# Patient Record
Sex: Female | Born: 1990 | Race: White | Hispanic: No | Marital: Single | Smoking: Never smoker
Health system: Southern US, Community
[De-identification: ages and names within clinical notes are randomized; demographics above are authoritative.]

## PROBLEM LIST (undated history)

## (undated) DIAGNOSIS — E559 Vitamin D deficiency, unspecified: Secondary | ICD-10-CM

## (undated) DIAGNOSIS — N390 Urinary tract infection, site not specified: Secondary | ICD-10-CM

## (undated) DIAGNOSIS — A749 Chlamydial infection, unspecified: Secondary | ICD-10-CM

## (undated) DIAGNOSIS — Z87442 Personal history of urinary calculi: Secondary | ICD-10-CM

## (undated) DIAGNOSIS — R51 Headache: Secondary | ICD-10-CM

## (undated) DIAGNOSIS — K219 Gastro-esophageal reflux disease without esophagitis: Secondary | ICD-10-CM

## (undated) DIAGNOSIS — N200 Calculus of kidney: Secondary | ICD-10-CM

## (undated) DIAGNOSIS — Z8619 Personal history of other infectious and parasitic diseases: Secondary | ICD-10-CM

## (undated) DIAGNOSIS — Z8719 Personal history of other diseases of the digestive system: Secondary | ICD-10-CM

## (undated) HISTORY — DX: Vitamin D deficiency, unspecified: E55.9

## (undated) HISTORY — DX: Chlamydial infection, unspecified: A74.9

## (undated) HISTORY — DX: Urinary tract infection, site not specified: N39.0

## (undated) HISTORY — DX: Personal history of other infectious and parasitic diseases: Z86.19

## (undated) HISTORY — DX: Personal history of other diseases of the digestive system: Z87.19

## (undated) HISTORY — PX: CHOLECYSTECTOMY: SHX55

## (undated) HISTORY — DX: Calculus of kidney: N20.0

## (undated) HISTORY — DX: Personal history of urinary calculi: Z87.442

---

## 2009-09-18 ENCOUNTER — Inpatient Hospital Stay (HOSPITAL_COMMUNITY): Admission: EM | Admit: 2009-09-18 | Discharge: 2009-09-26 | Payer: Self-pay | Admitting: Surgery

## 2009-09-18 ENCOUNTER — Ambulatory Visit: Payer: Self-pay | Admitting: Advanced Practice Midwife

## 2009-09-18 ENCOUNTER — Encounter: Payer: Self-pay | Admitting: Obstetrics and Gynecology

## 2009-09-20 ENCOUNTER — Encounter (INDEPENDENT_AMBULATORY_CARE_PROVIDER_SITE_OTHER): Payer: Self-pay | Admitting: Surgery

## 2009-09-20 ENCOUNTER — Ambulatory Visit: Payer: Self-pay | Admitting: Internal Medicine

## 2009-12-17 ENCOUNTER — Inpatient Hospital Stay (HOSPITAL_COMMUNITY): Admission: AD | Admit: 2009-12-17 | Discharge: 2009-12-17 | Payer: Self-pay | Admitting: Obstetrics and Gynecology

## 2010-01-05 ENCOUNTER — Encounter (INDEPENDENT_AMBULATORY_CARE_PROVIDER_SITE_OTHER): Payer: Self-pay | Admitting: Obstetrics and Gynecology

## 2010-01-05 ENCOUNTER — Inpatient Hospital Stay (HOSPITAL_COMMUNITY): Admission: RE | Admit: 2010-01-05 | Discharge: 2010-01-07 | Payer: Self-pay | Admitting: Obstetrics and Gynecology

## 2010-10-17 LAB — CBC
HCT: 34 % — ABNORMAL LOW (ref 36.0–46.0)
Hemoglobin: 11.9 g/dL — ABNORMAL LOW (ref 12.0–15.0)
MCHC: 35 g/dL (ref 30.0–36.0)
MCV: 88 fL (ref 78.0–100.0)
Platelets: 179 10*3/uL (ref 150–400)
Platelets: 189 10*3/uL (ref 150–400)
RBC: 3.9 MIL/uL (ref 3.87–5.11)
RDW: 12.7 % (ref 11.5–15.5)
RDW: 12.8 % (ref 11.5–15.5)
WBC: 9.4 10*3/uL (ref 4.0–10.5)

## 2010-10-20 LAB — COMPREHENSIVE METABOLIC PANEL
ALT: 114 U/L — ABNORMAL HIGH (ref 0–35)
AST: 116 U/L — ABNORMAL HIGH (ref 0–37)
BUN: 5 mg/dL — ABNORMAL LOW (ref 6–23)
Chloride: 105 mEq/L (ref 96–112)
Creatinine, Ser: 0.5 mg/dL (ref 0.4–1.2)
GFR calc Af Amer: >60 mL/min (ref >60)
GFR calc non Af Amer: >60 mL/min (ref >60)
Potassium: 3.7 mEq/L (ref 3.5–5.1)
Sodium: 137 mEq/L (ref 135–145)

## 2010-10-20 LAB — CBC
MCHC: 34.1 g/dL (ref 30.0–36.0)
MCV: 92.6 fL (ref 78.0–100.0)
Platelets: 200 10*3/uL (ref 150–400)
RBC: 4.19 MIL/uL (ref 3.87–5.11)
WBC: 9.1 10*3/uL (ref 4.0–10.5)

## 2010-10-20 LAB — AMYLASE: Amylase: 50 U/L (ref 0–105)

## 2010-10-21 LAB — COMPREHENSIVE METABOLIC PANEL
ALT: 61 U/L — ABNORMAL HIGH (ref 0–35)
AST: 32 U/L (ref 0–37)
AST: 43 U/L — ABNORMAL HIGH (ref 0–37)
AST: 64 U/L — ABNORMAL HIGH (ref 0–37)
Albumin: 2.1 g/dL — ABNORMAL LOW (ref 3.5–5.2)
Albumin: 2.2 g/dL — ABNORMAL LOW (ref 3.5–5.2)
Alkaline Phosphatase: 86 U/L (ref 39–117)
CO2: 26 mEq/L (ref 19–32)
CO2: 27 mEq/L (ref 19–32)
Calcium: 7.7 mg/dL — ABNORMAL LOW (ref 8.4–10.5)
Calcium: 8.1 mg/dL — ABNORMAL LOW (ref 8.4–10.5)
Calcium: 8.2 mg/dL — ABNORMAL LOW (ref 8.4–10.5)
Calcium: 8.5 mg/dL (ref 8.4–10.5)
Chloride: 108 mEq/L (ref 96–112)
Creatinine, Ser: 0.5 mg/dL (ref 0.4–1.2)
Creatinine, Ser: 0.56 mg/dL (ref 0.4–1.2)
Creatinine, Ser: 0.59 mg/dL (ref 0.4–1.2)
GFR calc Af Amer: >60 mL/min (ref >60)
GFR calc Af Amer: >60 mL/min (ref >60)
GFR calc Af Amer: >60 mL/min (ref >60)
GFR calc non Af Amer: >60 mL/min (ref >60)
GFR calc non Af Amer: >60 mL/min (ref >60)
Glucose, Bld: 105 mg/dL — ABNORMAL HIGH (ref 70–99)
Glucose, Bld: 67 mg/dL — ABNORMAL LOW (ref 70–99)
Glucose, Bld: 89 mg/dL (ref 70–99)
Potassium: 3.4 mEq/L — ABNORMAL LOW (ref 3.5–5.1)
Potassium: 3.8 mEq/L (ref 3.5–5.1)
Sodium: 138 mEq/L (ref 135–145)
Total Bilirubin: 1.7 mg/dL — ABNORMAL HIGH (ref 0.3–1.2)
Total Bilirubin: 2.5 mg/dL — ABNORMAL HIGH (ref 0.3–1.2)
Total Protein: 5.3 g/dL — ABNORMAL LOW (ref 6.0–8.3)
Total Protein: 5.3 g/dL — ABNORMAL LOW (ref 6.0–8.3)

## 2010-10-21 LAB — DIFFERENTIAL
Basophils Absolute: 0 10*3/uL (ref 0.0–0.1)
Basophils Absolute: 0 10*3/uL (ref 0.0–0.1)
Eosinophils Relative: 0 % (ref 0–5)
Eosinophils Relative: 1 % (ref 0–5)
Lymphocytes Relative: 10 % — ABNORMAL LOW (ref 12–46)
Lymphocytes Relative: 13 % (ref 12–46)
Lymphs Abs: 1.2 10*3/uL (ref 0.7–4.0)
Lymphs Abs: 1.3 10*3/uL (ref 0.7–4.0)
Monocytes Absolute: 1 10*3/uL (ref 0.1–1.0)
Monocytes Relative: 5 % (ref 3–12)
Monocytes Relative: 8 % (ref 3–12)
Neutro Abs: 8 10*3/uL — ABNORMAL HIGH (ref 1.7–7.7)
Neutrophils Relative %: 81 % — ABNORMAL HIGH (ref 43–77)

## 2010-10-21 LAB — CBC
HCT: 29.6 % — ABNORMAL LOW (ref 36.0–46.0)
HCT: 33.8 % — ABNORMAL LOW (ref 36.0–46.0)
HCT: 35 % — ABNORMAL LOW (ref 36.0–46.0)
Hemoglobin: 10.5 g/dL — ABNORMAL LOW (ref 12.0–15.0)
Hemoglobin: 12.4 g/dL (ref 12.0–15.0)
MCHC: 35.1 g/dL (ref 30.0–36.0)
MCHC: 35.5 g/dL (ref 30.0–36.0)
MCV: 91.9 fL (ref 78.0–100.0)
MCV: 92.7 fL (ref 78.0–100.0)
Platelets: 147 10*3/uL — ABNORMAL LOW (ref 150–400)
Platelets: 182 10*3/uL (ref 150–400)
RBC: 3.25 MIL/uL — ABNORMAL LOW (ref 3.87–5.11)
RBC: 3.67 MIL/uL — ABNORMAL LOW (ref 3.87–5.11)
RBC: 3.72 MIL/uL — ABNORMAL LOW (ref 3.87–5.11)
RBC: 3.8 MIL/uL — ABNORMAL LOW (ref 3.87–5.11)
RDW: 12.6 % (ref 11.5–15.5)
RDW: 12.9 % (ref 11.5–15.5)
WBC: 10.4 10*3/uL (ref 4.0–10.5)
WBC: 14.1 10*3/uL — ABNORMAL HIGH (ref 4.0–10.5)
WBC: 9.9 10*3/uL (ref 4.0–10.5)

## 2010-10-21 LAB — BASIC METABOLIC PANEL
GFR calc Af Amer: >60 mL/min (ref >60)
GFR calc non Af Amer: >60 mL/min (ref >60)
Glucose, Bld: 108 mg/dL — ABNORMAL HIGH (ref 70–99)
Potassium: 3.7 mEq/L (ref 3.5–5.1)
Sodium: 137 mEq/L (ref 135–145)

## 2010-10-21 LAB — LIPASE, BLOOD: Lipase: 1064 U/L — ABNORMAL HIGH (ref 11–59)

## 2010-10-21 LAB — AMYLASE: Amylase: 1605 U/L — ABNORMAL HIGH (ref 0–105)

## 2011-02-15 ENCOUNTER — Emergency Department (HOSPITAL_COMMUNITY)
Admission: EM | Admit: 2011-02-15 | Discharge: 2011-02-16 | Disposition: A | Discharge status: Home / Self Care | Payer: Medicaid Other | Attending: Emergency Medicine | Admitting: Emergency Medicine

## 2011-02-15 DIAGNOSIS — R197 Diarrhea, unspecified: Secondary | ICD-10-CM | POA: Insufficient documentation

## 2011-02-15 DIAGNOSIS — O219 Vomiting of pregnancy, unspecified: Secondary | ICD-10-CM | POA: Insufficient documentation

## 2011-03-08 LAB — GC/CHLAMYDIA PROBE AMP, GENITAL
Chlamydia: NEGATIVE
Gonorrhea: NEGATIVE

## 2011-03-08 LAB — ABO/RH: RH Type: POSITIVE

## 2011-04-15 IMAGING — US US ABDOMEN COMPLETE
1 series · 13 of 25 positions shown · non-contrast
Comparison: None

CLINICAL DATA: 18-year-old pregnant female with elevated LFTs and
abdominal pain.

ABDOMINAL ULTRASOUND COMPLETE

[Series 1: us abdomen complete · 0.25mm/px · 13 of 92 slices shown]
[im 1/92]
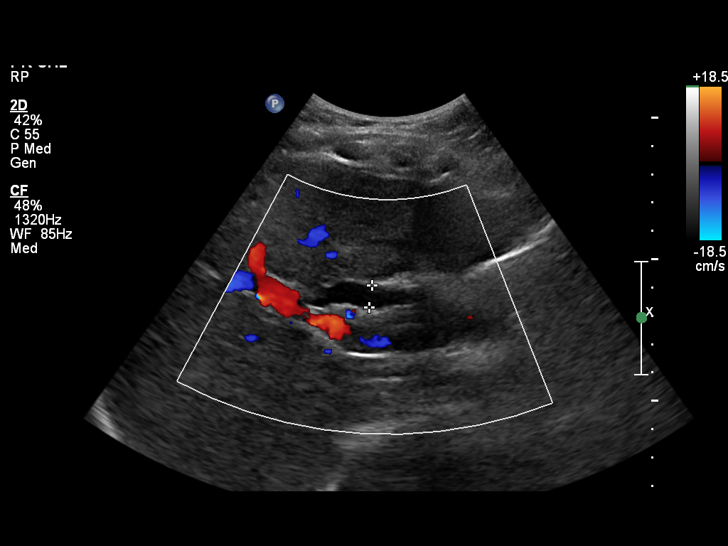
[im 8/92]
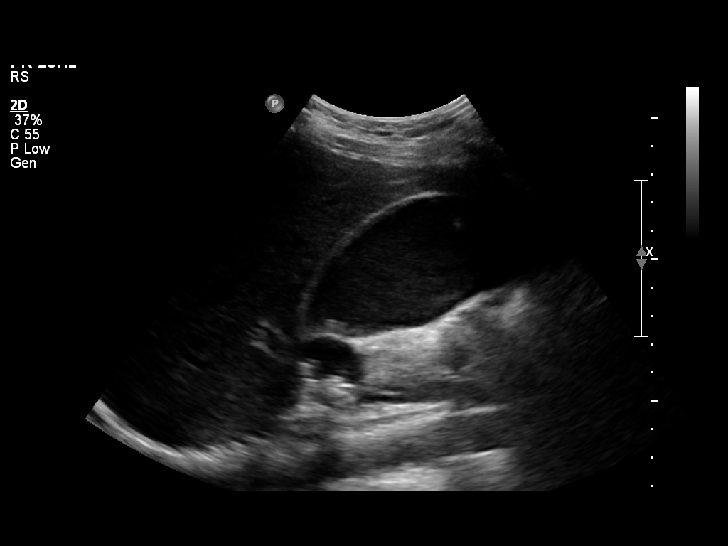
[im 16/92]
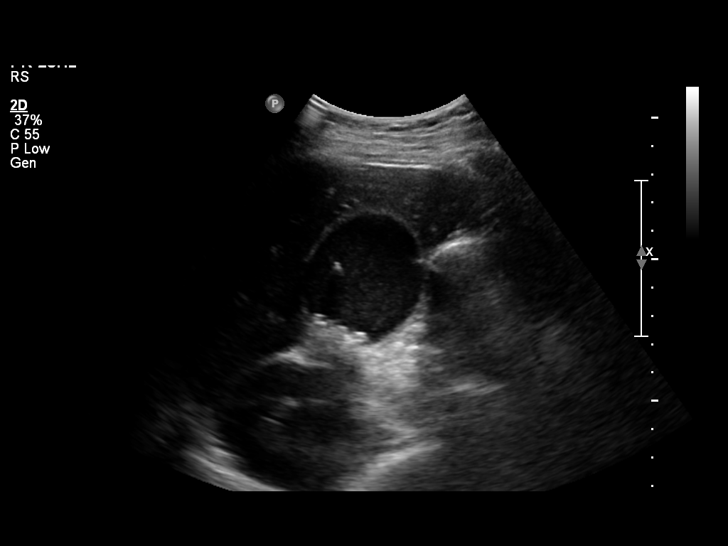
[im 23/92]
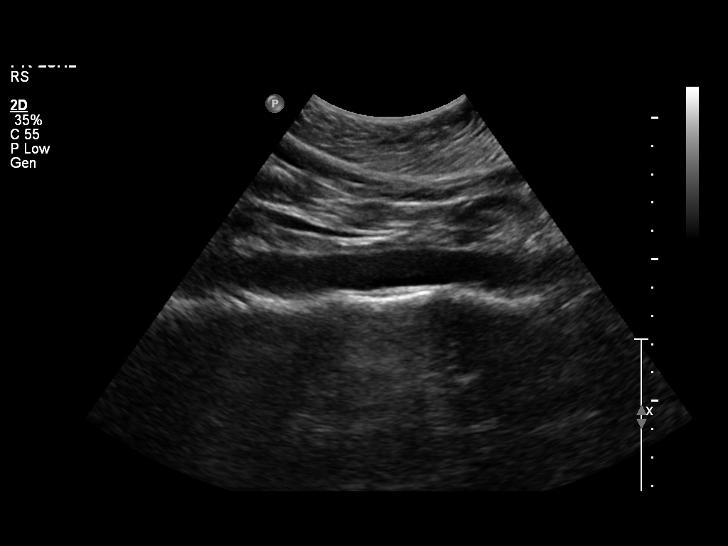
[im 31/92]
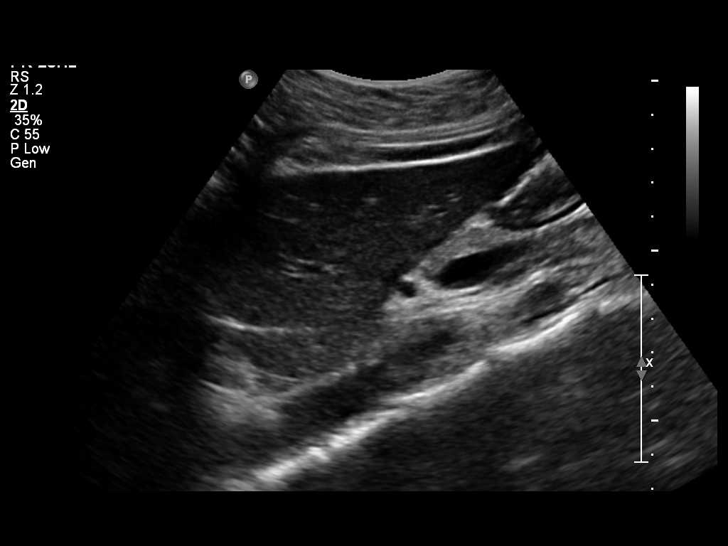
[im 38/92]
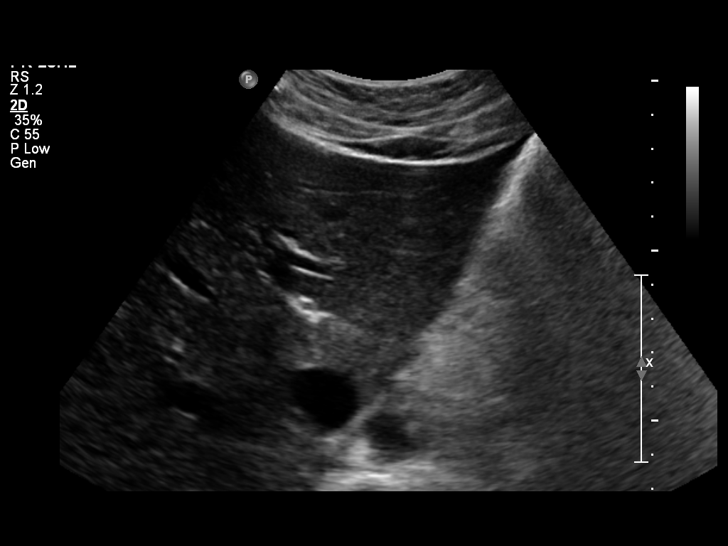
[im 46/92]
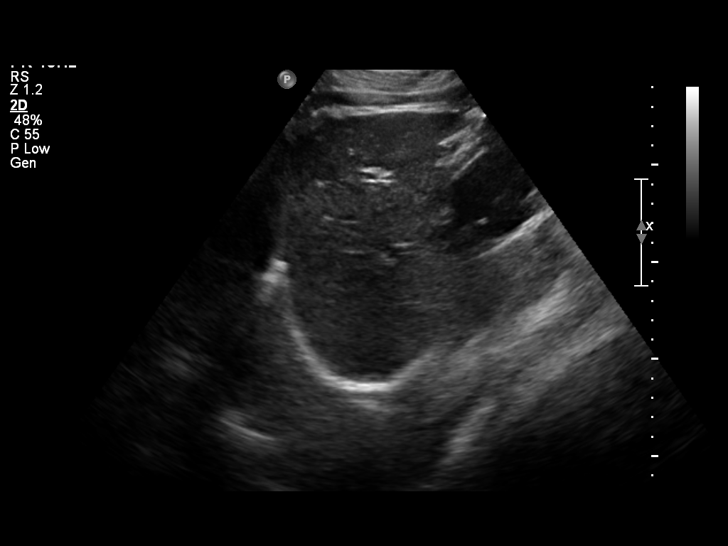
[im 54/92]
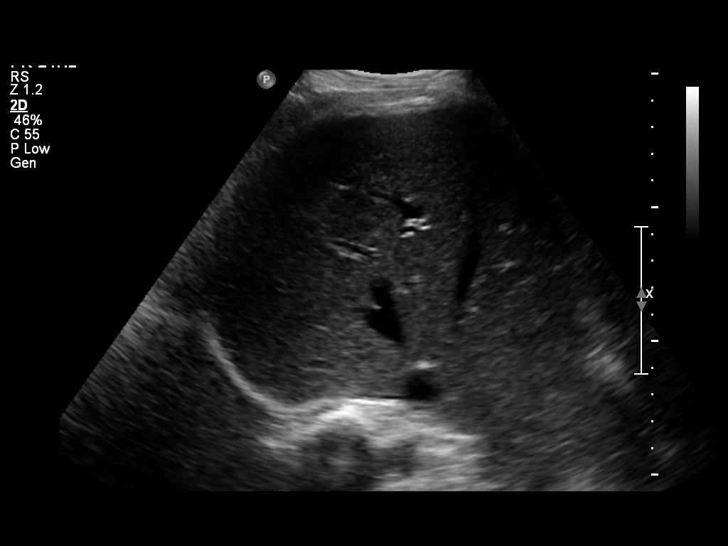
[im 61/92]
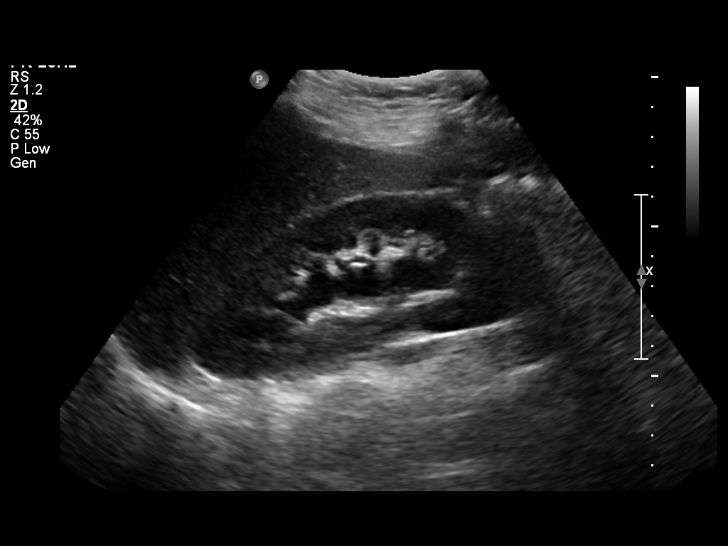
[im 69/92]
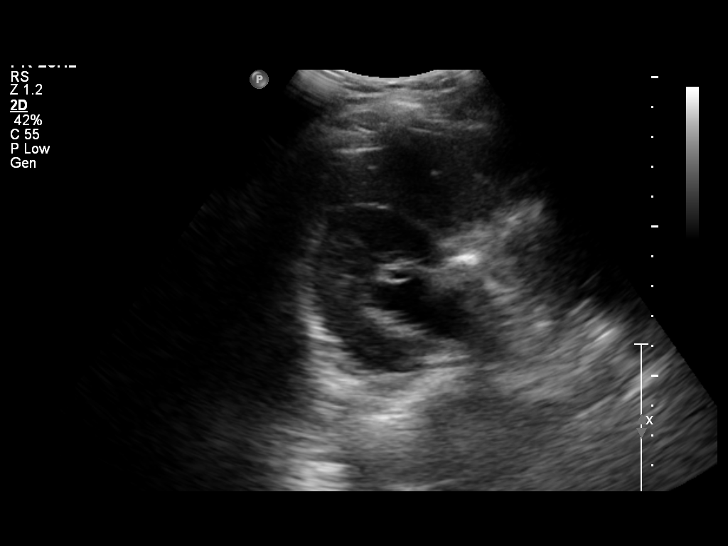
[im 76/92]
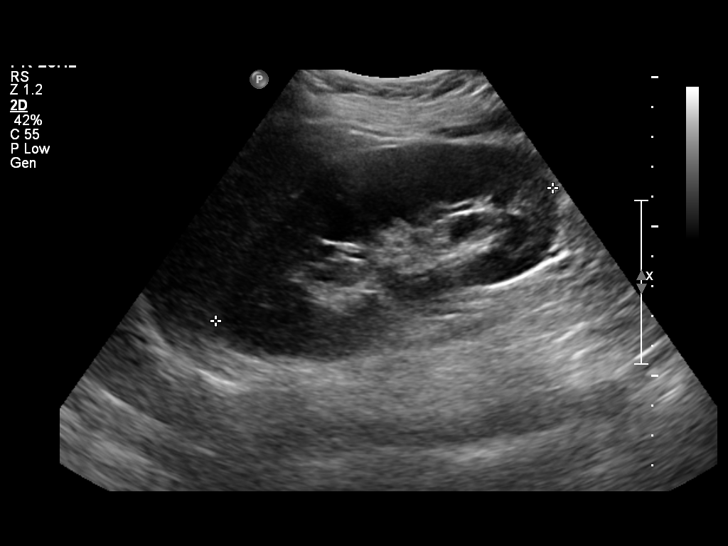
[im 84/92]
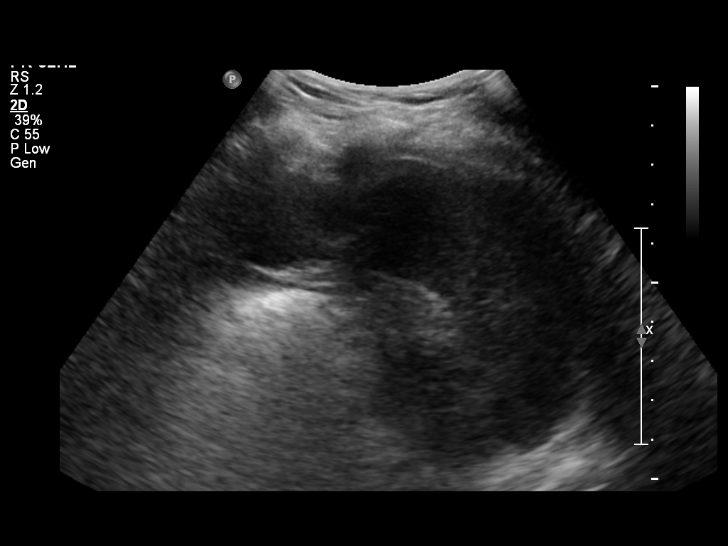
[im 92/92]
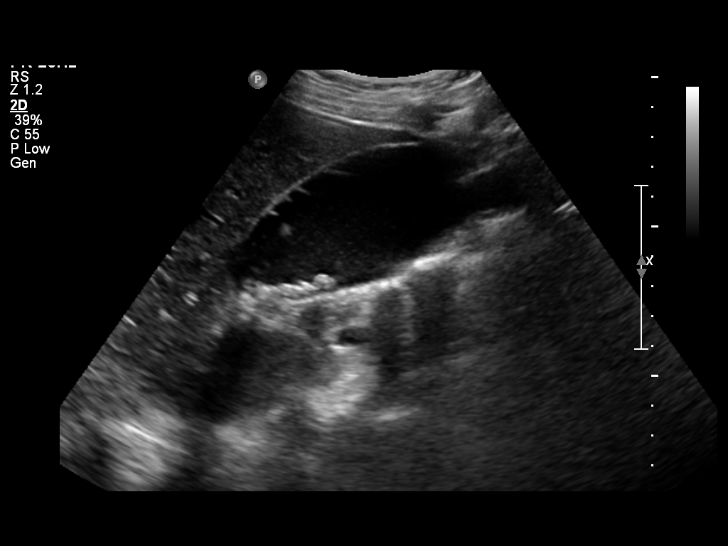

[13 of 25 positions shown; findings below may reference images not displayed]

FINDINGS: Gallbladder:  Small mobile gallstones are identified as well as
gallbladder sludge.  There is no evidence of gallbladder wall
thickening, pericholecystic fluid, or sonographic Murphy's sign.

Common Bile Duct:  Mild intrahepatic biliary fullness is identified
as well as mild CBD dilatation.  The CBD measures 8 mm in greatest
diameter.

Liver:  The liver is within normal limits in parenchymal
echogenicity. No focal abnormalities are identified.

IVC:  Appears normal.

Pancreas:  Although the pancreas is difficult to visualize in its
entirety, no focal pancreatic abnormality is identified.

Spleen:  Within normal limits in size and echotexture.

Right kidney:  The right kidney is normal in size and parenchymal
echogenicity. Mild right hydronephrosis is likely related to
pregnancy. There is no evidence of solid mass or definite renal
calculi.  The right kidney measures 12.2 cm.

Left kidney:  The left kidney is normal in size and parenchymal
echogenicity.  There is no evidence of solid mass, hydronephrosis
or definite renal calculi.   The left kidney measures 12.1 cm.

Abdominal Aorta:  No abdominal aortic aneurysm identified.

There is no evidence of ascites.
IMPRESSION: Cholelithiasis and gallbladder sludge without evidence of acute
cholecystitis.

Mild biliary dilatation without definite obstructing cause
identified.  Recommend GI consultation or further evaluation.

Mild right hydronephrosis likely related to pregnancy.

## 2011-04-16 IMAGING — RF DG CHOLANGIOGRAM OPERATIVE
1 series · 8 of 8 positions shown · non-contrast
Comparison: None

CLINICAL DATA: Cholelithiasis, pregnant

INTRAOPERATIVE CHOLANGIOGRAM
TECHNIQUE: Cholangiographic images from the C-arm fluoroscopic
device were submitted for interpretation post-operatively.  Please
see the procedural report for the amount of contrast and the
fluoroscopy time utilized.

[Series 1: run · 2 acquisitions, 8 frames shown]
[im 1/2]
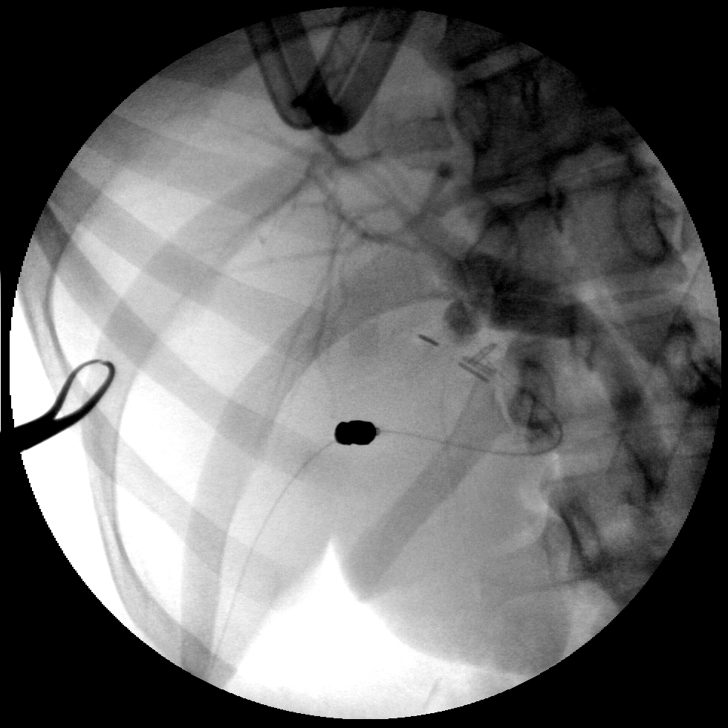
[im 1/2]
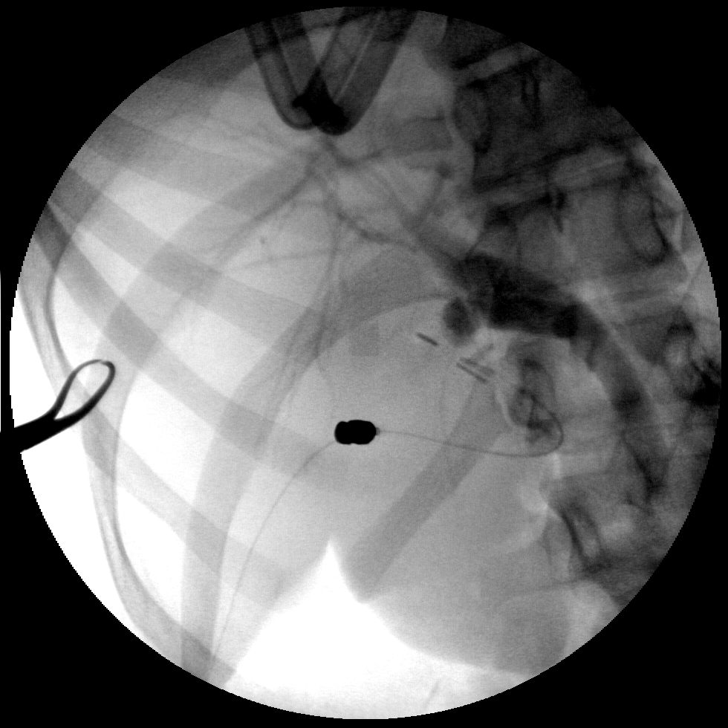
[im 1/2]
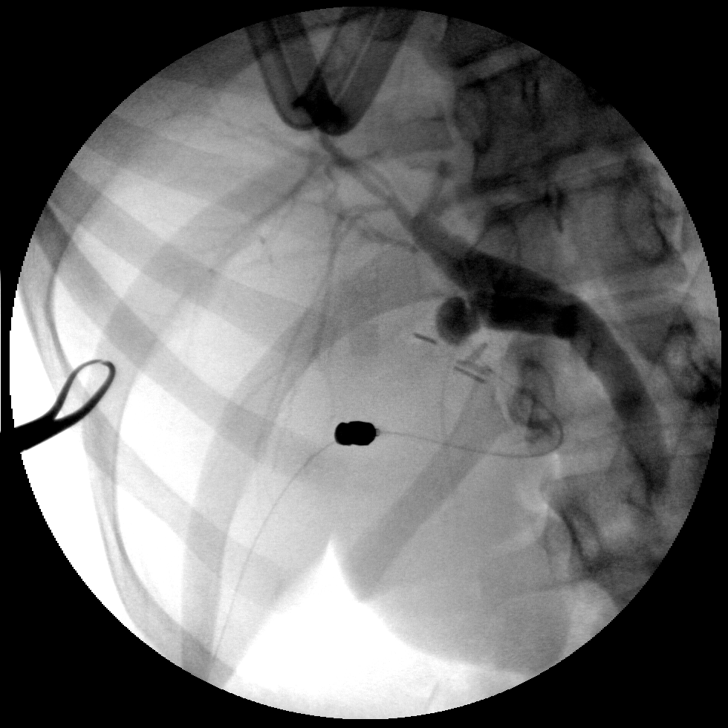
[im 1/2]
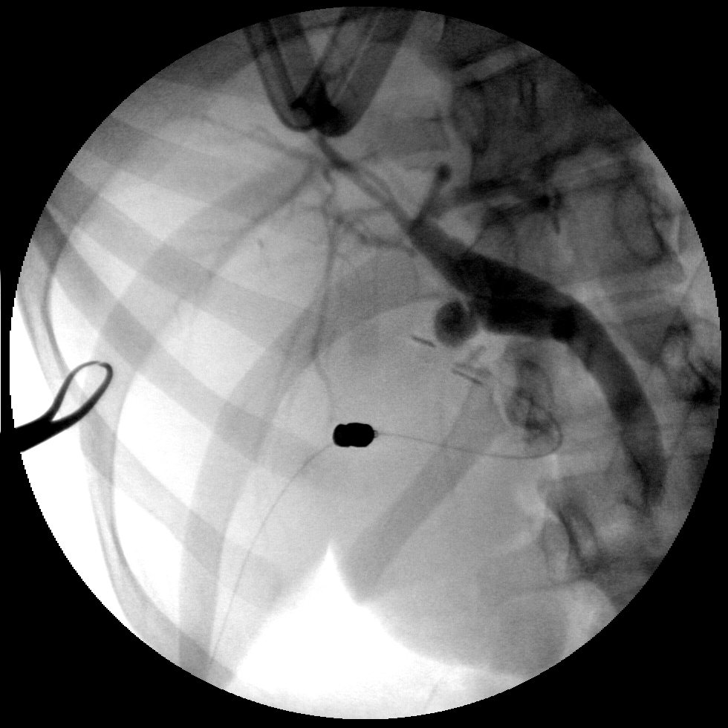
[im 2/2]
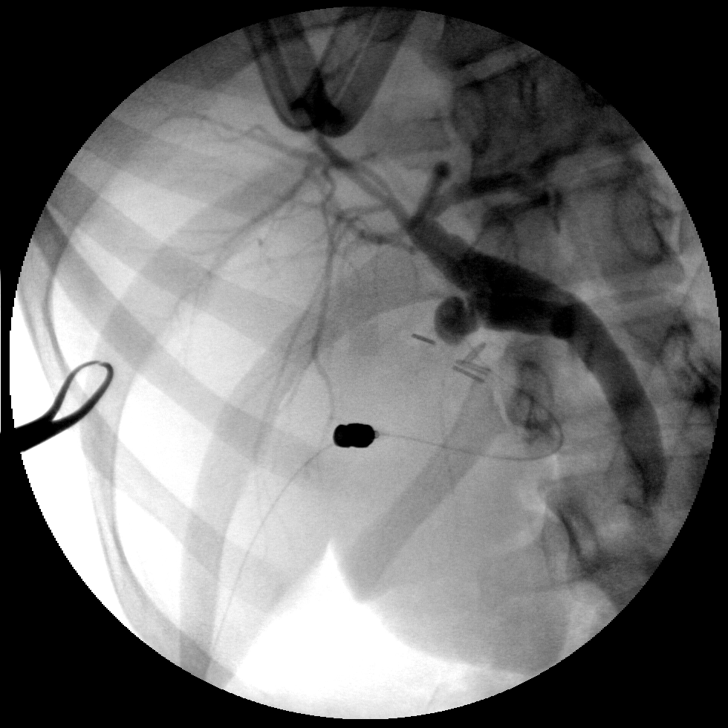
[im 2/2]
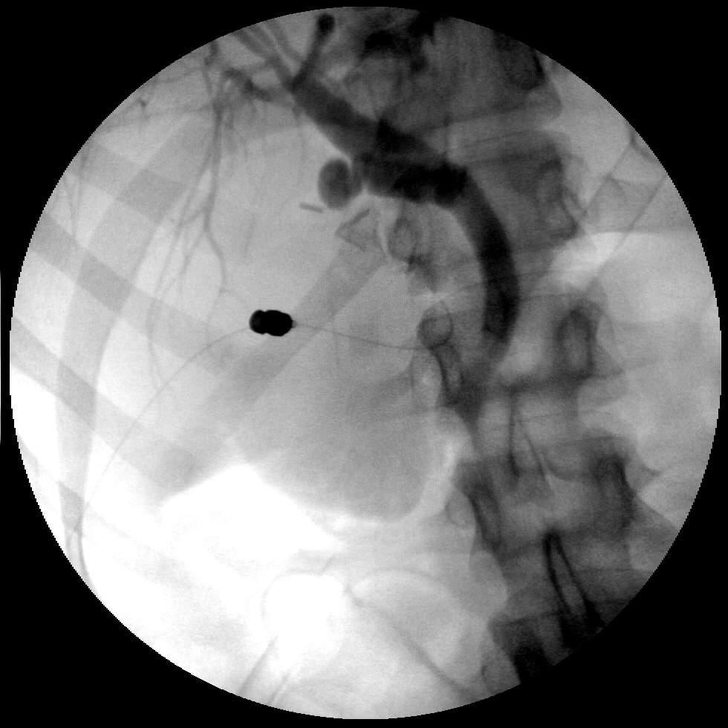
[im 2/2]
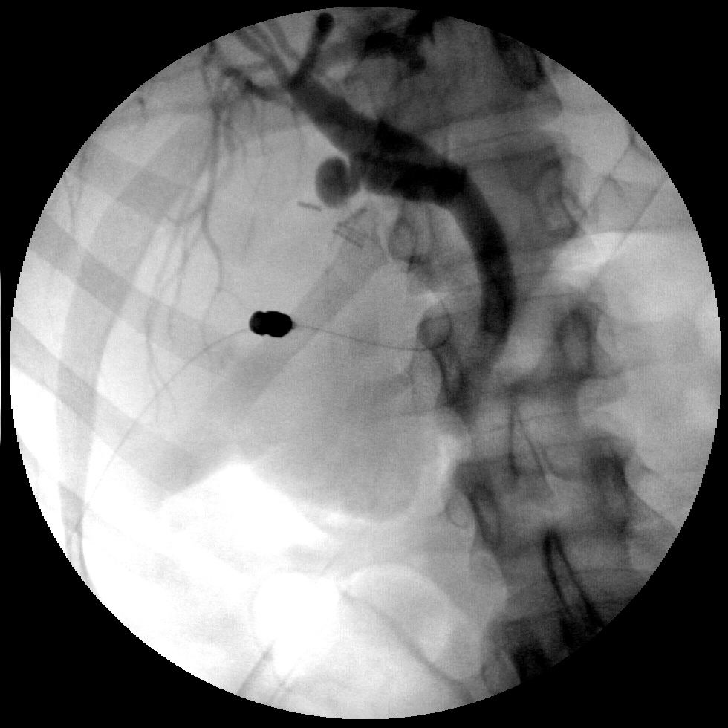
[im 2/2]
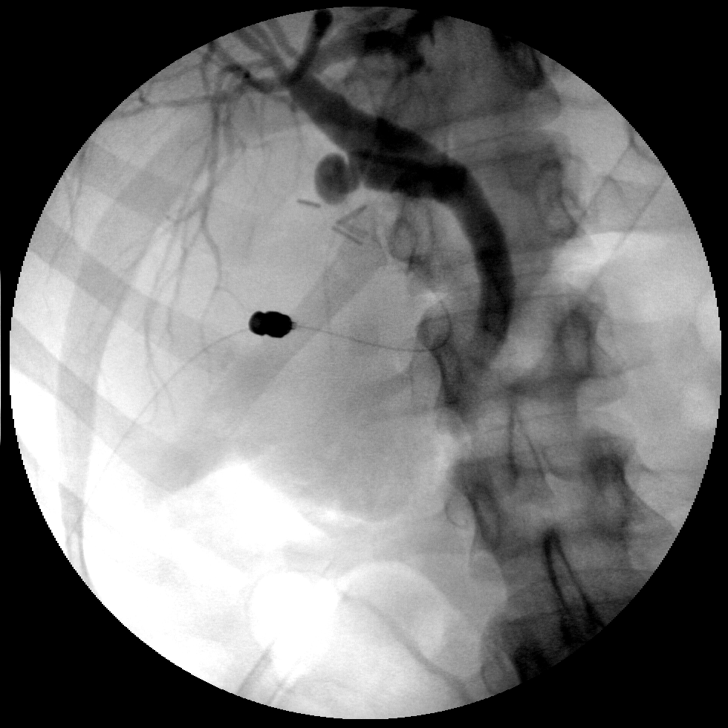

[8 of 8 positions shown; findings below may reference images not displayed]

FINDINGS: No persistent filling defects in the common duct.
Intrahepatic ducts are incompletely visualized, appearing
decompressed centrally. There is no contrast passage beyond the
ampulla into the duodenum.

IMPRESSION

Obstruction of the distal common bile duct suggesting retained
stone.

## 2011-04-16 IMAGING — CR DG CHEST 1V PORT
1 series · 1 of 1 positions shown · non-contrast
Comparison: None.

CLINICAL DATA: Postop evaluation.

PORTABLE CHEST - 1 VIEW

[view not recorded]
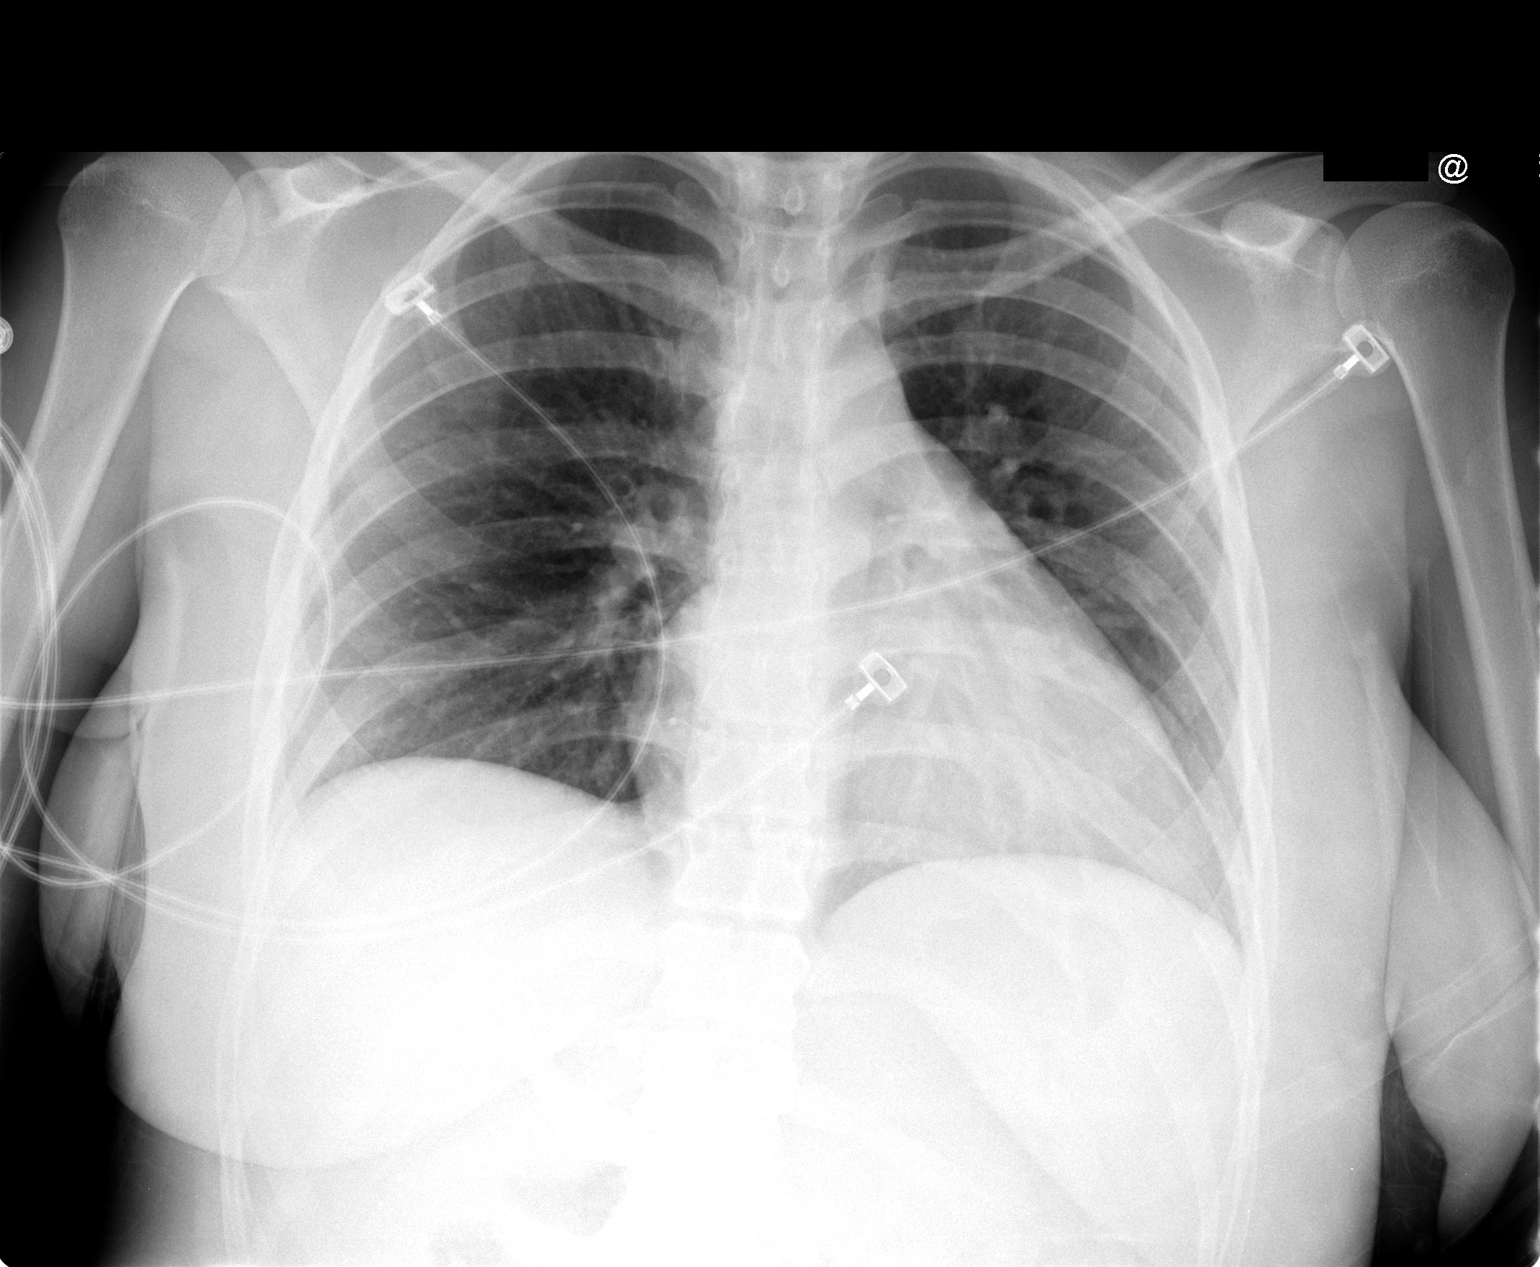

[1 of 1 positions shown; findings below may reference images not displayed]

FINDINGS: Normal mediastinum and cardiac silhouette.  Costophrenic
angles are clear.  No evidence of effusion, infiltrate,
pneumothorax.  There is mild atelectasis at the left lung base.
IMPRESSION: 1.  Mild atelectasis at the left lung base.
2.  No evidence of consolidation or pneumonitis.

## 2011-04-18 IMAGING — RF DG ERCP WO/W SPHINCTEROTOMY
1 series · 15 of 19 positions shown · non-contrast
Comparison: Intraoperative cholangiogram dated 09/19/2009.

CLINICAL DATA: Distal common duct obstruction suggesting a retained
stone on an intraoperative cholangiogram during a cholecystectomy
on 09/19/2009.

ERCP with sphincterotomy
TECHNIQUE: Multiple spot images obtained with the fluoroscopic
device and submitted for interpretation post-procedure.  ERCP was
performed by Dr. Aujla.

[Series 1: run · 15 of 19 slices shown]
[im 1/19]
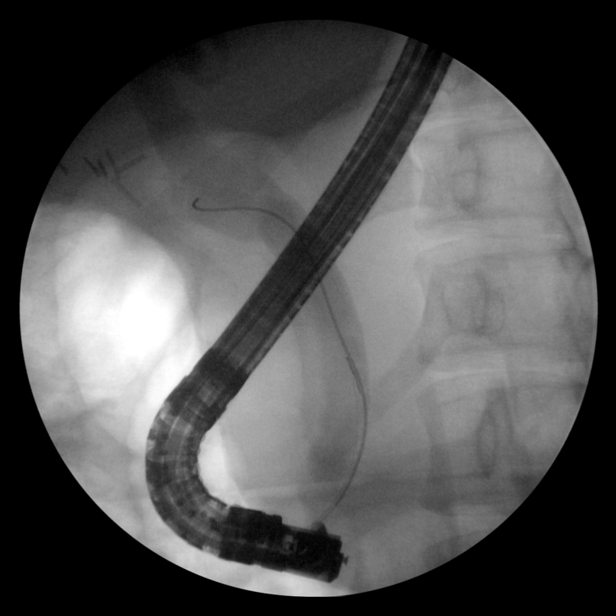
[im 2/19]
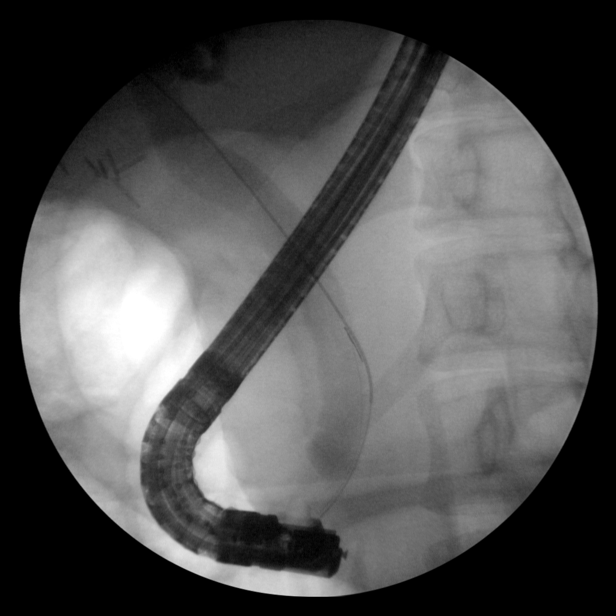
[im 4/19]
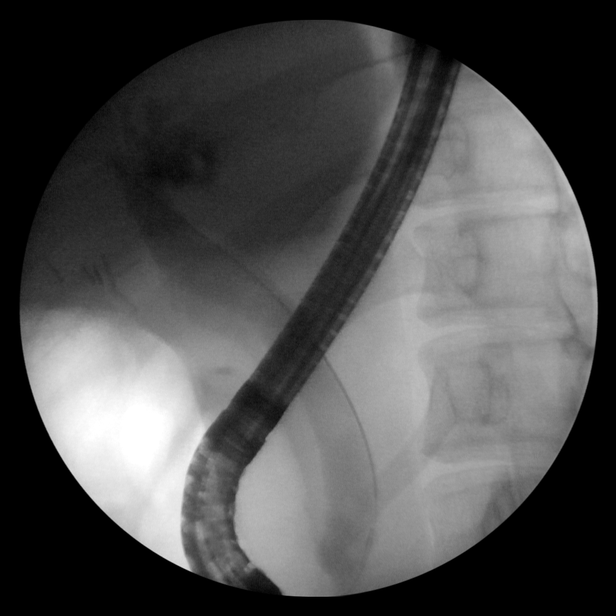
[im 5/19]
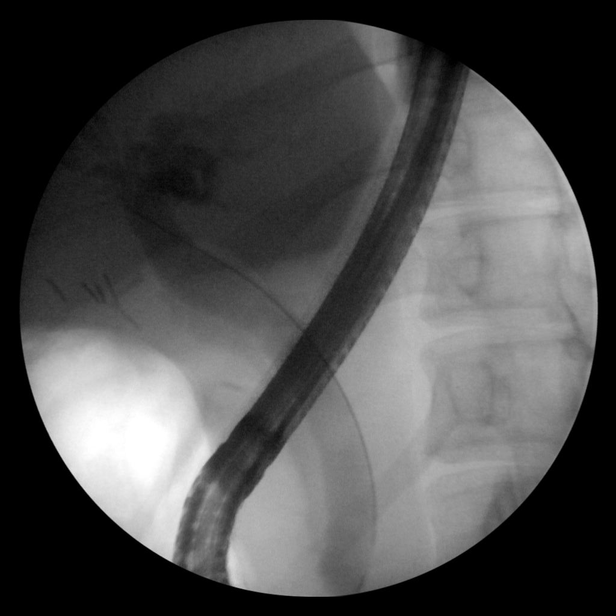
[im 6/19]
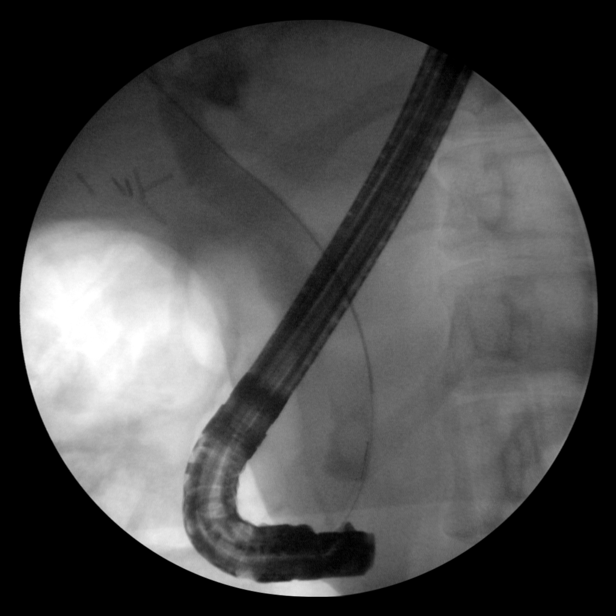
[im 7/19]
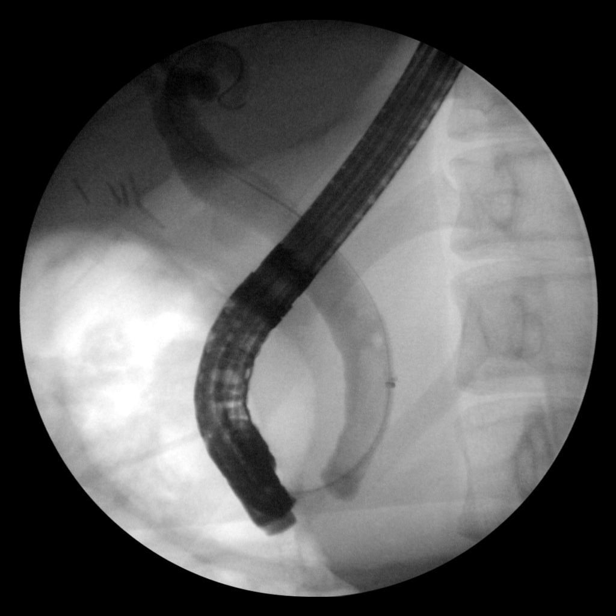
[im 9/19]
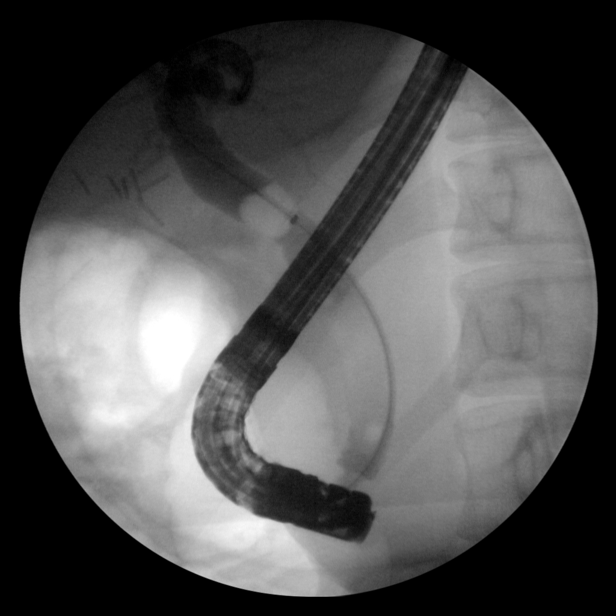
[im 10/19]
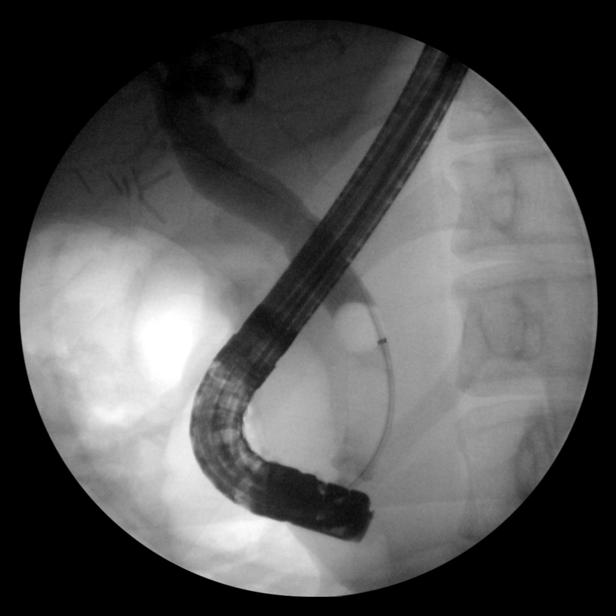
[im 11/19]
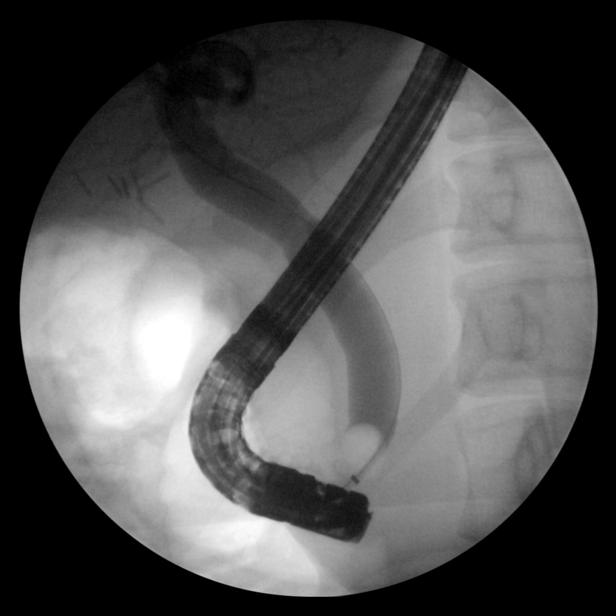
[im 13/19]
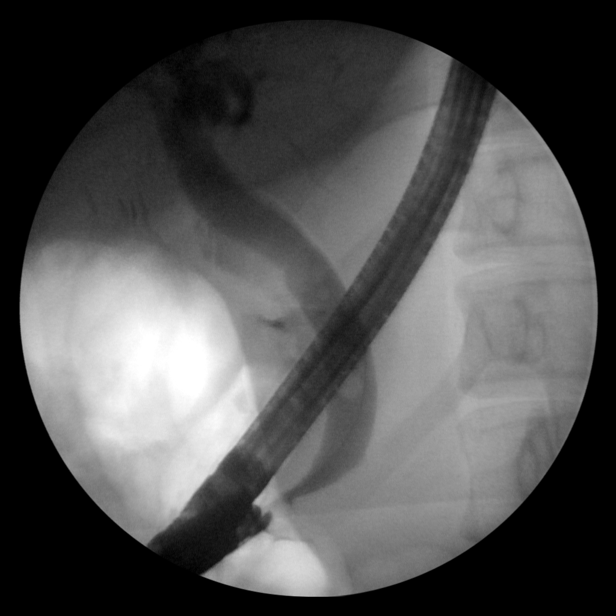
[im 14/19]
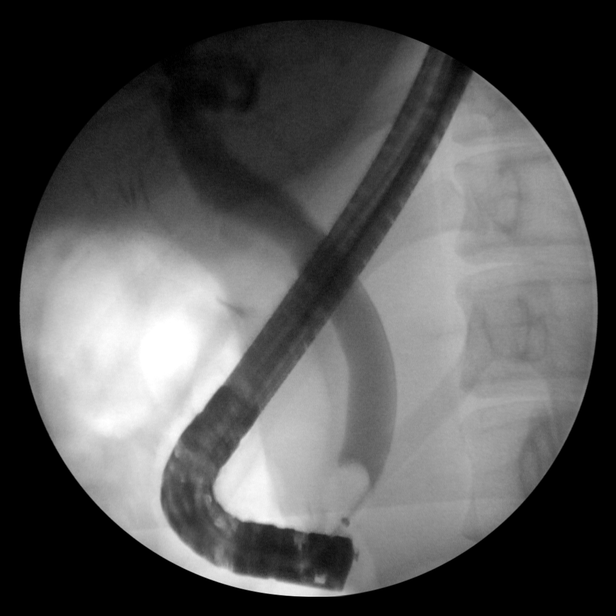
[im 15/19]
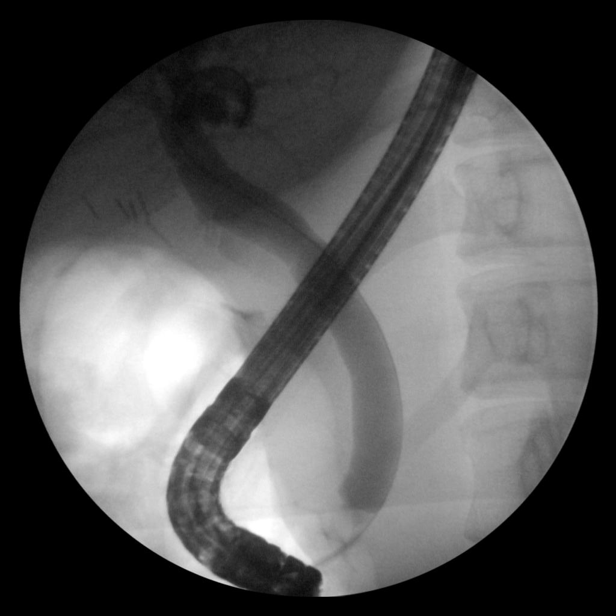
[im 16/19]
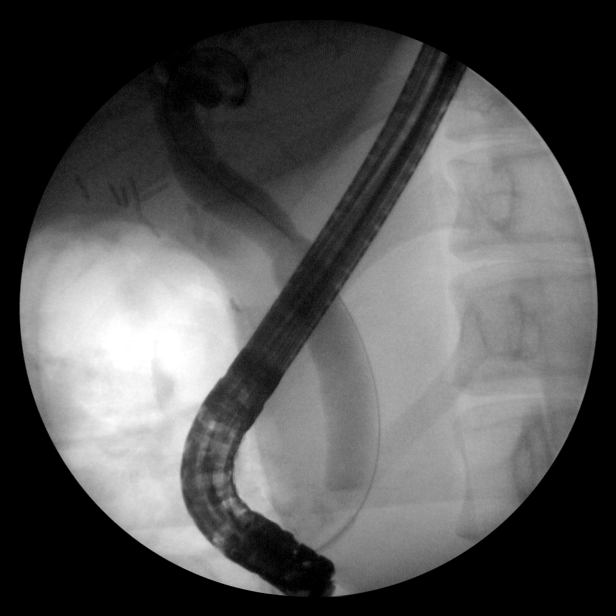
[im 18/19]
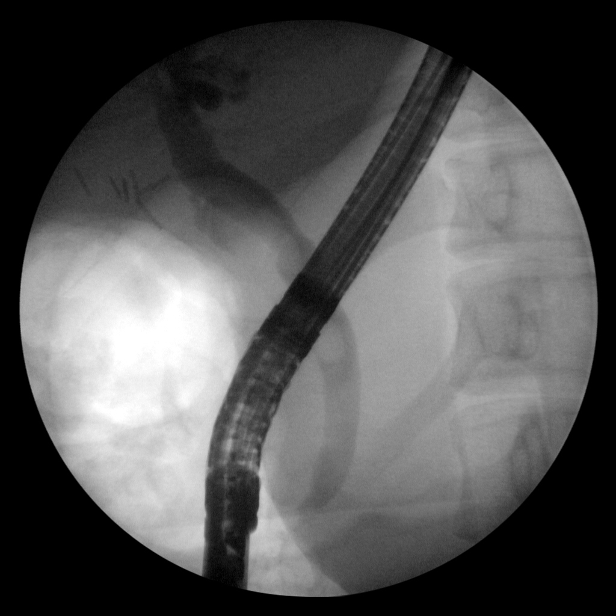
[im 19/19]
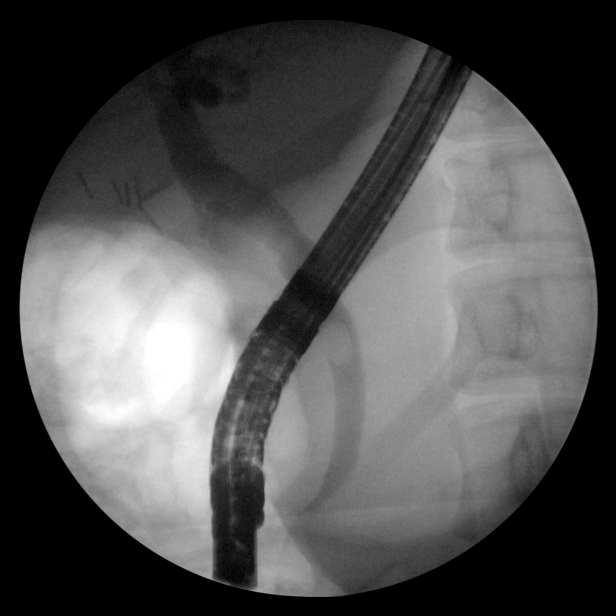

[15 of 19 positions shown; findings below may reference images not displayed]

FINDINGS: Some of the initial images are not available for
interpretation.  Dr. Aujla  reports that these demonstrated a
normal pancreatic duct and three common duct stones.  A
sphincterotomy was performed.  The current images demonstrate
passage of a balloon catheter through a dilated common duct.  There
are some filling defects following passage, which Dr. Aujla reports
represented air bubbles that subsequently cleared.  Three stones
were extracted.  Cholecystectomy clips are noted.
IMPRESSION: Endoscopic sphincterotomy and stone extraction, as described above.

These images were submitted for radiologic interpretation only.
Please see the procedural report for the amount of contrast.  21
minutes and 48 seconds of fluoroscopy time used.

## 2011-08-24 LAB — STREP B DNA PROBE: GBS: NEGATIVE

## 2011-08-31 ENCOUNTER — Inpatient Hospital Stay (HOSPITAL_COMMUNITY): Admission: AD | Admit: 2011-08-31 | Payer: Self-pay | Source: Ambulatory Visit | Admitting: Obstetrics and Gynecology

## 2011-09-06 ENCOUNTER — Telehealth (HOSPITAL_COMMUNITY): Payer: Self-pay | Admitting: *Deleted

## 2011-09-06 ENCOUNTER — Encounter (HOSPITAL_COMMUNITY): Payer: Self-pay | Admitting: *Deleted

## 2011-09-06 NOTE — Telephone Encounter (Signed)
Preadmission screen  

## 2011-09-15 ENCOUNTER — Encounter (HOSPITAL_COMMUNITY): Payer: Self-pay | Admitting: Anesthesiology

## 2011-09-15 ENCOUNTER — Encounter (HOSPITAL_COMMUNITY): Payer: Self-pay

## 2011-09-15 ENCOUNTER — Inpatient Hospital Stay (HOSPITAL_COMMUNITY): Payer: Medicaid Other | Admitting: Anesthesiology

## 2011-09-15 ENCOUNTER — Inpatient Hospital Stay (HOSPITAL_COMMUNITY)
Admission: RE | Admit: 2011-09-15 | Discharge: 2011-09-17 | DRG: 775 | Disposition: A | Discharge status: Home / Self Care | Payer: Medicaid Other | Source: Ambulatory Visit | Attending: Obstetrics and Gynecology | Admitting: Obstetrics and Gynecology

## 2011-09-15 ENCOUNTER — Other Ambulatory Visit: Payer: Self-pay | Admitting: Obstetrics and Gynecology

## 2011-09-15 DIAGNOSIS — O365990 Maternal care for other known or suspected poor fetal growth, unspecified trimester, not applicable or unspecified: Principal | ICD-10-CM | POA: Diagnosis present

## 2011-09-15 HISTORY — DX: Headache: R51

## 2011-09-15 HISTORY — DX: Gastro-esophageal reflux disease without esophagitis: K21.9

## 2011-09-15 LAB — CBC
Hemoglobin: 10.6 g/dL — ABNORMAL LOW (ref 12.0–15.0)
MCH: 27.6 pg (ref 26.0–34.0)
MCHC: 33.2 g/dL (ref 30.0–36.0)
MCV: 83.1 fL (ref 78.0–100.0)
RBC: 3.84 MIL/uL — ABNORMAL LOW (ref 3.87–5.11)

## 2011-09-15 LAB — ABO/RH: ABO/RH(D): O POS

## 2011-09-15 LAB — RPR: RPR Ser Ql: NONREACTIVE

## 2011-09-15 MED ORDER — BUTORPHANOL TARTRATE 2 MG/ML IJ SOLN
1.0000 mg | Freq: Once | INTRAMUSCULAR | Status: AC
  Administered 2011-09-15: 1 mg via INTRAVENOUS
  Filled 2011-09-15: qty 1

## 2011-09-15 MED ORDER — PHENYLEPHRINE 40 MCG/ML (10ML) SYRINGE FOR IV PUSH (FOR BLOOD PRESSURE SUPPORT): 80.0000 ug | PREFILLED_SYRINGE | INTRAVENOUS | Status: DC | PRN

## 2011-09-15 MED ORDER — FENTANYL 2.5 MCG/ML BUPIVACAINE 1/10 % EPIDURAL INFUSION (WH - ANES)
INTRAMUSCULAR | Status: DC | PRN
  Administered 2011-09-15: 14 mL/h via EPIDURAL

## 2011-09-15 MED ORDER — ONDANSETRON HCL 4 MG/2ML IJ SOLN: 4.0000 mg | INTRAMUSCULAR | Status: DC | PRN

## 2011-09-15 MED ORDER — ONDANSETRON HCL 4 MG PO TABS: 4.0000 mg | ORAL_TABLET | ORAL | Status: DC | PRN

## 2011-09-15 MED ORDER — BENZOCAINE-MENTHOL 20-0.5 % EX AERO: 1.0000 "application " | INHALATION_SPRAY | CUTANEOUS | Status: DC | PRN

## 2011-09-15 MED ORDER — OXYTOCIN 20 UNITS IN LACTATED RINGERS INFUSION - SIMPLE
125.0000 mL/h | INTRAVENOUS | Status: DC
  Administered 2011-09-15: 125 mL/h via INTRAVENOUS

## 2011-09-15 MED ORDER — ZOLPIDEM TARTRATE 5 MG PO TABS: 5.0000 mg | ORAL_TABLET | Freq: Every evening | ORAL | Status: DC | PRN

## 2011-09-15 MED ORDER — OXYTOCIN 20 UNITS IN LACTATED RINGERS INFUSION - SIMPLE
1.0000 m[IU]/min | INTRAVENOUS | Status: DC
  Administered 2011-09-15: 3 m[IU]/min via INTRAVENOUS
  Administered 2011-09-15: 999 m[IU]/min via INTRAVENOUS
  Administered 2011-09-15: 1 m[IU]/min via INTRAVENOUS

## 2011-09-15 MED ORDER — EPHEDRINE 5 MG/ML INJ: 10.0000 mg | INTRAVENOUS | Status: DC | PRN

## 2011-09-15 MED ORDER — DIPHENHYDRAMINE HCL 25 MG PO CAPS: 25.0000 mg | ORAL_CAPSULE | Freq: Four times a day (QID) | ORAL | Status: DC | PRN

## 2011-09-15 MED ORDER — PHENYLEPHRINE 40 MCG/ML (10ML) SYRINGE FOR IV PUSH (FOR BLOOD PRESSURE SUPPORT)
80.0000 ug | PREFILLED_SYRINGE | INTRAVENOUS | Status: DC | PRN
  Filled 2011-09-15: qty 5

## 2011-09-15 MED ORDER — IBUPROFEN 600 MG PO TABS
600.0000 mg | ORAL_TABLET | Freq: Four times a day (QID) | ORAL | Status: DC
  Administered 2011-09-15: 600 mg via ORAL
  Filled 2011-09-15: qty 1

## 2011-09-15 MED ORDER — PRENATAL MULTIVITAMIN CH: 1.0000 | ORAL_TABLET | Freq: Every day | ORAL | Status: DC

## 2011-09-15 MED ORDER — IBUPROFEN 600 MG PO TABS: 600.0000 mg | ORAL_TABLET | Freq: Four times a day (QID) | ORAL | Status: DC | PRN

## 2011-09-15 MED ORDER — TETANUS-DIPHTH-ACELL PERTUSSIS 5-2.5-18.5 LF-MCG/0.5 IM SUSP: 0.5000 mL | Freq: Once | INTRAMUSCULAR | Status: DC

## 2011-09-15 MED ORDER — LACTATED RINGERS IV SOLN
INTRAVENOUS | Status: DC
  Administered 2011-09-15 (×2): via INTRAVENOUS

## 2011-09-15 MED ORDER — OXYCODONE-ACETAMINOPHEN 5-325 MG PO TABS: 1.0000 | ORAL_TABLET | ORAL | Status: DC | PRN

## 2011-09-15 MED ORDER — DIBUCAINE 1 % RE OINT: 1.0000 "application " | TOPICAL_OINTMENT | RECTAL | Status: DC | PRN

## 2011-09-15 MED ORDER — LIDOCAINE HCL 1.5 % IJ SOLN
INTRAMUSCULAR | Status: DC | PRN
  Administered 2011-09-15 (×2): 5 mL via EPIDURAL

## 2011-09-15 MED ORDER — LACTATED RINGERS IV SOLN: 500.0000 mL | Freq: Once | INTRAVENOUS | Status: DC

## 2011-09-15 MED ORDER — SENNOSIDES-DOCUSATE SODIUM 8.6-50 MG PO TABS: 2.0000 | ORAL_TABLET | Freq: Every day | ORAL | Status: DC

## 2011-09-15 MED ORDER — DIPHENHYDRAMINE HCL 25 MG PO CAPS
25.0000 mg | ORAL_CAPSULE | Freq: Four times a day (QID) | ORAL | Status: DC | PRN
  Administered 2011-09-15: 25 mg via ORAL
  Filled 2011-09-15: qty 1

## 2011-09-15 MED ORDER — SIMETHICONE 80 MG PO CHEW: 80.0000 mg | CHEWABLE_TABLET | ORAL | Status: DC | PRN

## 2011-09-15 MED ORDER — OXYTOCIN BOLUS FROM INFUSION
500.0000 mL | Freq: Once | INTRAVENOUS | Status: DC
  Filled 2011-09-15: qty 500
  Filled 2011-09-15: qty 1000

## 2011-09-15 MED ORDER — OXYTOCIN 20 UNITS IN LACTATED RINGERS INFUSION - SIMPLE
125.0000 mL/h | Freq: Once | INTRAVENOUS | Status: DC
  Filled 2011-09-15: qty 1000

## 2011-09-15 MED ORDER — MEASLES, MUMPS & RUBELLA VAC ~~LOC~~ INJ
0.5000 mL | INJECTION | Freq: Once | SUBCUTANEOUS | Status: DC
  Filled 2011-09-15: qty 0.5

## 2011-09-15 MED ORDER — DIPHENHYDRAMINE HCL 50 MG/ML IJ SOLN: 12.5000 mg | INTRAMUSCULAR | Status: DC | PRN

## 2011-09-15 MED ORDER — LACTATED RINGERS IV SOLN: 500.0000 mL | INTRAVENOUS | Status: DC | PRN

## 2011-09-15 MED ORDER — TERBUTALINE SULFATE 1 MG/ML IJ SOLN: 0.2500 mg | Freq: Once | INTRAMUSCULAR | Status: DC | PRN

## 2011-09-15 MED ORDER — OXYTOCIN 20 UNITS IN LACTATED RINGERS INFUSION - SIMPLE: 1.0000 m[IU]/min | INTRAVENOUS | Status: DC

## 2011-09-15 MED ORDER — FENTANYL 2.5 MCG/ML BUPIVACAINE 1/10 % EPIDURAL INFUSION (WH - ANES)
14.0000 mL/h | INTRAMUSCULAR | Status: DC
  Administered 2011-09-15: 14 mL/h via EPIDURAL
  Filled 2011-09-15 (×2): qty 60

## 2011-09-15 MED ORDER — CLINDAMYCIN PHOSPHATE 900 MG/50ML IV SOLN
900.0000 mg | Freq: Three times a day (TID) | INTRAVENOUS | Status: DC
  Administered 2011-09-15: 900 mg via INTRAVENOUS
  Filled 2011-09-15 (×3): qty 50

## 2011-09-15 MED ORDER — EPHEDRINE 5 MG/ML INJ
10.0000 mg | INTRAVENOUS | Status: DC | PRN
  Filled 2011-09-15: qty 4

## 2011-09-15 MED ORDER — LIDOCAINE HCL (PF) 1 % IJ SOLN
30.0000 mL | INTRAMUSCULAR | Status: DC | PRN
  Administered 2011-09-15: 30 mL via SUBCUTANEOUS
  Filled 2011-09-15: qty 30

## 2011-09-15 MED ORDER — LANOLIN HYDROUS EX OINT: TOPICAL_OINTMENT | CUTANEOUS | Status: DC | PRN

## 2011-09-15 MED ORDER — WITCH HAZEL-GLYCERIN EX PADS
1.0000 "application " | MEDICATED_PAD | CUTANEOUS | Status: DC | PRN
  Administered 2011-09-16: 1 via TOPICAL

## 2011-09-15 NOTE — Progress Notes (Signed)
Transferred to 143 via wheelchair with significant other and infant

## 2011-09-15 NOTE — Anesthesia Procedure Notes (Signed)
Epidural Patient location during procedure: OB Start time: 09/15/2011 3:20 PM End time: 09/15/2011 3:24 PM Reason for block: procedure for pain  Staffing Anesthesiologist: Sandrea Hughs Performed by: anesthesiologist   Preanesthetic Checklist Completed: patient identified, site marked, surgical consent, pre-op evaluation, timeout performed, IV checked, risks and benefits discussed and monitors and equipment checked  Epidural Patient position: sitting Prep: site prepped and draped and DuraPrep Patient monitoring: continuous pulse ox and blood pressure Approach: midline Injection technique: LOR air  Needle:  Needle type: Tuohy  Needle gauge: 17 G Needle length: 9 cm Needle insertion depth: 6 cm Catheter type: closed end flexible Catheter size: 19 Gauge Catheter at skin depth: 12 cm Test dose: negative and 1.5% lidocaine  Assessment Sensory level: T8 Events: blood not aspirated, injection not painful, no injection resistance, negative IV test and no paresthesia

## 2011-09-15 NOTE — Anesthesia Postprocedure Evaluation (Signed)
Anesthesia Post Note  Patient: Michelle Foley  Procedure(s) Performed: * No procedures listed *  Anesthesia type: Epidural  Patient location: Mother/Baby  Post pain: Pain level controlled  Post assessment: Post-op Vital signs reviewed  Last Vitals:  Filed Vitals:   09/15/11 2320  BP: 114/72  Pulse: 104  Temp: 36.7 C  Resp: 20    Post vital signs: Reviewed  Level of consciousness: awake  Complications: No apparent anesthesia complications

## 2011-09-15 NOTE — Progress Notes (Signed)
Patient ID: Michelle Foley, female   DOB: 1990/10/06, 21 y.o.   MRN: [id number] Pitocin at 17 mu/ minute and contractions q 2-3 minutes. The pt has an epidural and is comfortable. Cervix 5 cm 70% effaced and the vertex is at - 2 station.

## 2011-09-15 NOTE — H&P (Signed)
NAMEVERLENE, GLANTZ              ACCOUNT NO.:  [id number]  MEDICAL RECORD NO.:  [id number]  LOCATION:  9171                          FACILITY:  [facility]  PHYSICIAN:  Malachi Pro. Ambrose Mantle, M.D. DATE OF BIRTH:  02/01/1991  DATE OF ADMISSION:  09/15/2011 DATE OF DISCHARGE:                             HISTORY & PHYSICAL   HISTORY OF PRESENT ILLNESS:  This is a 21 year old white female, para 1- 0-0-1, gravida 2, EDC September 21, 2011, admitted to the hospital for induction of labor because of a favorable cervix and suspected intrauterine growth restriction.  Blood group and type O positive, negative antibody, rubella immune, RPR nonreactive, urine culture negative.  Hepatitis B surface antigen negative, HIV negative, GC and Chlamydia negative.  First trimester screen declined.  Cystic fibrosis negative.  AFP declined.  One-hour Glucola 70.  Group B strep negative. Repeat GC and Chlamydia negative.  Repeat RPR negative.  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  This has happened with her first pregnancy.  The patient's prenatal course was complicated by small for gestational age as far as fundal height measurements were concerned.  She began to show fundal height lag at about 34 weeks.  She has had reactive nonstress tests, normal AFIs.  Her most recent nonstress test was done on September 14, 2011.  Her cervix has been 2 cm dilated, 50% effaced, vertex at -2 for the last 2 prenatal visits, and she is admitted now for induction of labor.  PAST MEDICAL HISTORY:  She is allergic to Augmentin in terms that it cause nausea and vomiting, but no true allergy.  She has no other drug intolerances.  She is not allergic to latex.  She has had a cholecystectomy during her prior pregnancy in February 2010.  Illnesses: She has had the history of cholelithiasis.  She had gonorrhea.  She has had a kidney stone, acute pancreatitis during the pregnancy  initially.  OBSTETRIC HISTORY:  In June of 2011, she delivered a 6-pound-8-ounce female infant vaginally.  The pregnancy was complicated as stated by cholelithiasis, pancreatitis, and cholecystectomy.  The patient did go to the 12th grade.  She does not use alcohol, tobacco, or illicit substance abuse.  She is not married, but is in a relationship.  PHYSICAL EXAMINATION:  VITAL SIGNS:  On admission, blood pressure is 102/60, pulse is 80. HEART:  Normal size and sounds.  No murmurs. LUNGS:  Clear to auscultation. ABDOMEN:  Soft.  Fundal height was 35 cm on September 14, 2011. CERVIX:  2 cm, 50%, vertex at -2.  ADMITTING IMPRESSION:  Intrauterine pregnancy at 39 weeks and 1 day, possible intrauterine growth restriction.  The patient is admitted for Pitocin induction of labor.    Malachi Pro. Ambrose Mantle, M.D.    TFH/MEDQ  D:  09/15/2011  T:  09/15/2011  Job:  [id number]

## 2011-09-15 NOTE — Progress Notes (Signed)
Patient ID: Michelle Foley, female   DOB: 23-Nov-1990, 21 y.o.   MRN: [id number] Delivery note: The pt became fully dilated and pushed well. She delivered a living female infant spontaneously OA over an intact perineum. Weight 7 pounds 0 ounces and Apgars 8 at 1 and 9 at 5 minutes. The placenta delivered intact and the uterus was normal. There was a tear just inferior to the clitoris that was bleeding so it was made hemostatic with 3-0 vicryl sutures and pressure.EBL 400 cc's.

## 2011-09-15 NOTE — Anesthesia Preprocedure Evaluation (Addendum)
Anesthesia Evaluation  Patient identified by MRN, date of birth, ID band Patient awake    Reviewed: Allergy & Precautions, H&P , NPO status , Patient's Chart, lab work & pertinent test results  Airway Mallampati: II TM Distance: >3 FB Neck ROM: full    Dental No notable dental hx.    Pulmonary neg pulmonary ROS,    Pulmonary exam normal       Cardiovascular neg cardio ROS     Neuro/Psych Negative Psych ROS   GI/Hepatic negative GI ROS, Neg liver ROS, GERD-  ,  Endo/Other  Negative Endocrine ROSMorbid obesity  Renal/GU negative Renal ROS  Genitourinary negative   Musculoskeletal negative musculoskeletal ROS (+)   Abdominal Normal abdominal exam  (+)   Peds  Hematology negative hematology ROS (+)   Anesthesia Other Findings   Reproductive/Obstetrics (+) Pregnancy                           Anesthesia Physical Anesthesia Plan  ASA: II  Anesthesia Plan: Epidural   Post-op Pain Management:    Induction:   Airway Management Planned:   Additional Equipment:   Intra-op Plan:   Post-operative Plan:   Informed Consent: I have reviewed the patients History and Physical, chart, labs and discussed the procedure including the risks, benefits and alternatives for the proposed anesthesia with the patient or authorized representative who has indicated his/her understanding and acceptance.     Plan Discussed with:   Anesthesia Plan Comments:        Anesthesia Quick Evaluation

## 2011-09-16 ENCOUNTER — Encounter (HOSPITAL_COMMUNITY): Payer: Self-pay

## 2011-09-16 LAB — CBC
Hemoglobin: 10.3 g/dL — ABNORMAL LOW (ref 12.0–15.0)
MCHC: 33.2 g/dL (ref 30.0–36.0)
Platelets: 175 10*3/uL (ref 150–400)
RDW: 13.4 % (ref 11.5–15.5)

## 2011-09-16 MED ORDER — WITCH HAZEL-GLYCERIN EX PADS: 1.0000 "application " | MEDICATED_PAD | CUTANEOUS | Status: DC | PRN

## 2011-09-16 MED ORDER — OXYTOCIN 20 UNITS IN LACTATED RINGERS INFUSION - SIMPLE: 125.0000 mL/h | INTRAVENOUS | Status: AC

## 2011-09-16 MED ORDER — TETANUS-DIPHTH-ACELL PERTUSSIS 5-2.5-18.5 LF-MCG/0.5 IM SUSP: 0.5000 mL | Freq: Once | INTRAMUSCULAR | Status: DC

## 2011-09-16 MED ORDER — SENNOSIDES-DOCUSATE SODIUM 8.6-50 MG PO TABS
2.0000 | ORAL_TABLET | Freq: Every day | ORAL | Status: DC
  Administered 2011-09-16: 2 via ORAL

## 2011-09-16 MED ORDER — SIMETHICONE 80 MG PO CHEW: 80.0000 mg | CHEWABLE_TABLET | ORAL | Status: DC | PRN

## 2011-09-16 MED ORDER — OXYCODONE-ACETAMINOPHEN 5-325 MG PO TABS
1.0000 | ORAL_TABLET | ORAL | Status: DC | PRN
  Administered 2011-09-16 – 2011-09-17 (×2): 1 via ORAL
  Filled 2011-09-16 (×2): qty 1

## 2011-09-16 MED ORDER — BENZOCAINE-MENTHOL 20-0.5 % EX AERO: 1.0000 "application " | INHALATION_SPRAY | CUTANEOUS | Status: DC | PRN

## 2011-09-16 MED ORDER — PRENATAL MULTIVITAMIN CH
1.0000 | ORAL_TABLET | Freq: Every day | ORAL | Status: DC
  Administered 2011-09-16 – 2011-09-17 (×2): 1 via ORAL
  Filled 2011-09-16 (×2): qty 1

## 2011-09-16 MED ORDER — ONDANSETRON HCL 4 MG PO TABS: 4.0000 mg | ORAL_TABLET | ORAL | Status: DC | PRN

## 2011-09-16 MED ORDER — BENZOCAINE-MENTHOL 20-0.5 % EX AERO
INHALATION_SPRAY | CUTANEOUS | Status: AC
  Administered 2011-09-16: 06:00:00
  Filled 2011-09-16: qty 56

## 2011-09-16 MED ORDER — ZOLPIDEM TARTRATE 5 MG PO TABS: 5.0000 mg | ORAL_TABLET | Freq: Every evening | ORAL | Status: DC | PRN

## 2011-09-16 MED ORDER — LANOLIN HYDROUS EX OINT: TOPICAL_OINTMENT | CUTANEOUS | Status: DC | PRN

## 2011-09-16 MED ORDER — ONDANSETRON HCL 4 MG/2ML IJ SOLN: 4.0000 mg | INTRAMUSCULAR | Status: DC | PRN

## 2011-09-16 MED ORDER — IBUPROFEN 600 MG PO TABS
600.0000 mg | ORAL_TABLET | Freq: Four times a day (QID) | ORAL | Status: DC | PRN
  Administered 2011-09-16 – 2011-09-17 (×5): 600 mg via ORAL
  Filled 2011-09-16 (×5): qty 1

## 2011-09-16 MED ORDER — DIBUCAINE 1 % RE OINT: 1.0000 "application " | TOPICAL_OINTMENT | RECTAL | Status: DC | PRN

## 2011-09-16 MED ORDER — MEASLES, MUMPS & RUBELLA VAC ~~LOC~~ INJ
0.5000 mL | INJECTION | Freq: Once | SUBCUTANEOUS | Status: DC
  Filled 2011-09-16: qty 0.5

## 2011-09-16 NOTE — Anesthesia Postprocedure Evaluation (Signed)
Anesthesia Post Note  Patient: Michelle Foley  Procedure(s) Performed: * No procedures listed *  Anesthesia type: Epidural  Patient location: Mother/Baby  Post pain: Pain level controlled  Post assessment: Post-op Vital signs reviewed  Last Vitals:  Filed Vitals:   09/16/11 1020  BP: 122/77  Pulse: 79  Temp: 36.4 C  Resp: 18    Post vital signs: Reviewed  Level of consciousness: awake  Complications: No apparent anesthesia complications

## 2011-09-16 NOTE — Progress Notes (Signed)
Patient ID: Michelle Foley, female   DOB: 1990/08/16, 21 y.o.   MRN: [id number] #1 afebrile BP normal HGB stable no complaints

## 2011-09-16 NOTE — Addendum Note (Signed)
Addendum  created 09/16/11 1028 by Doreene Burke, CRNA   Modules edited:Charges VN, Notes Section

## 2011-09-17 MED ORDER — IBUPROFEN 600 MG PO TABS: 600.0000 mg | ORAL_TABLET | Freq: Four times a day (QID) | ORAL | Status: AC | PRN

## 2011-09-17 MED ORDER — OXYCODONE-ACETAMINOPHEN 5-325 MG PO TABS: 1.0000 | ORAL_TABLET | Freq: Four times a day (QID) | ORAL | Status: AC | PRN

## 2011-09-17 NOTE — Progress Notes (Signed)
Patient ID: Michelle Foley, female   DOB: 28-Apr-1991, 21 y.o.   MRN: [id number] #2 afebrile BP normal for D/C

## 2011-09-17 NOTE — Discharge Summary (Signed)
NAMESHERIANN, NEWMANN              ACCOUNT NO.:  [id number]  MEDICAL RECORD NO.:  [id number]  LOCATION:  9143                          FACILITY:  [facility]  PHYSICIAN:  Malachi Pro. Ambrose Mantle, M.D. DATE OF BIRTH:  [birth year]  DATE OF ADMISSION:  09/15/2011 DATE OF DISCHARGE:  09/17/2011                              DISCHARGE SUMMARY   A 21 year old white female, para 1-0-0-1, gravida 2, EDC September 21, 2011, admitted for induction of labor.  Blood group and type O positive, negative antibody, rubella immune, RPR nonreactive, urine culture negative, hepatitis B surface antigen negative.  HIV, GC, and Chlamydia negative.  First trimester screen declined.  Cystic fibrosis negative. AFP declined.  One-hour Glucola 70.  Group B strep negative.  The patient had repeatedly positive HIV test with negative Western blot. She has a false-positive HIV.  This was present during her first pregnancy also.  After admission to the hospital, the patient was placed on Pitocin.  By 5:08 p.m. on the day of admission, her contractions were every 2-3 minutes on 17 milliunits a minute of Pitocin and the cervix was 5 cm.  She became fully dilated, pushed well, and delivered a living female infant, spontaneously OA over an intact perineum.  The weight was 7 pounds 0 ounces.  Apgars 8 at 1 and 9 at 5 minutes.  The placenta delivered intact.  Uterus was normal.  There was a tear just inferior to the clitoris that was bleeding, so it was made hemostatic with 3-0 Vicryl sutures.  Postpartum, the patient did quite well and was discharged on the second postpartum day.  LABORATORY DATA:  Initial hemoglobin 10.6, hematocrit 31.9, white count 7900, platelet count 198,000.  Followup hemoglobin 10.3.  RPR was nonreactive.  FINAL DIAGNOSIS:  Intrauterine pregnancy 39+ weeks, delivered vertex.  OPERATIONS:  Spontaneous delivery vertex, repair of laceration just inferior to the clitoris.  FINAL CONDITION:   Improved.  INSTRUCTIONS:  Include our regular discharge instruction booklet.  The patient is given prescriptions for Motrin and Percocet; Motrin 600 mg, #30 tablets, 1 every 6 hours as needed for pain and Percocet 5/325, #30 tablets, 1 every 6 hours as needed for pain.  She is to return to the office in 6 weeks for followup examination.  She declines Depo-Provera at discharge.     Malachi Pro. Ambrose Mantle, M.D.     TFH/MEDQ  D:  09/17/2011  T:  09/17/2011  Job:  [id number]

## 2011-09-18 NOTE — Progress Notes (Signed)
Post discharge chart review completed.  

## 2014-06-01 ENCOUNTER — Encounter (HOSPITAL_COMMUNITY): Payer: Self-pay

## 2018-12-01 ENCOUNTER — Other Ambulatory Visit: Payer: Self-pay

## 2018-12-01 ENCOUNTER — Encounter (HOSPITAL_COMMUNITY): Payer: Self-pay | Admitting: Pharmacy Technician

## 2018-12-01 ENCOUNTER — Emergency Department (HOSPITAL_COMMUNITY)
Admission: EM | Admit: 2018-12-01 | Discharge: 2018-12-01 | Disposition: A | Discharge status: Home / Self Care | Payer: Medicaid Other | Attending: Emergency Medicine | Admitting: Emergency Medicine

## 2018-12-01 ENCOUNTER — Emergency Department (HOSPITAL_COMMUNITY): Payer: Medicaid Other

## 2018-12-01 DIAGNOSIS — N200 Calculus of kidney: Secondary | ICD-10-CM

## 2018-12-01 DIAGNOSIS — N132 Hydronephrosis with renal and ureteral calculous obstruction: Secondary | ICD-10-CM | POA: Diagnosis not present

## 2018-12-01 DIAGNOSIS — R1031 Right lower quadrant pain: Secondary | ICD-10-CM | POA: Diagnosis present

## 2018-12-01 LAB — CBC WITH DIFFERENTIAL/PLATELET
Abs Immature Granulocytes: 0.05 10*3/uL (ref 0.00–0.07)
Basophils Absolute: 0.1 10*3/uL (ref 0.0–0.1)
Basophils Relative: 1 %
Eosinophils Absolute: 0 10*3/uL (ref 0.0–0.5)
Eosinophils Relative: 0 %
HCT: 42.4 % (ref 36.0–46.0)
Hemoglobin: 14 g/dL (ref 12.0–15.0)
Immature Granulocytes: 0 %
Lymphocytes Relative: 9 %
Lymphs Abs: 1.2 10*3/uL (ref 0.7–4.0)
MCH: 28.6 pg (ref 26.0–34.0)
MCHC: 33 g/dL (ref 30.0–36.0)
MCV: 86.5 fL (ref 80.0–100.0)
Monocytes Absolute: 0.6 10*3/uL (ref 0.1–1.0)
Monocytes Relative: 4 %
Neutro Abs: 11.6 10*3/uL — ABNORMAL HIGH (ref 1.7–7.7)
Neutrophils Relative %: 86 %
Platelets: 291 10*3/uL (ref 150–400)
RBC: 4.9 MIL/uL (ref 3.87–5.11)
RDW: 12.3 % (ref 11.5–15.5)
WBC: 13.4 10*3/uL — ABNORMAL HIGH (ref 4.0–10.5)
nRBC: 0 % (ref 0.0–0.2)

## 2018-12-01 LAB — COMPREHENSIVE METABOLIC PANEL
ALT: 15 U/L (ref 0–44)
AST: 19 U/L (ref 15–41)
Albumin: 4.2 g/dL (ref 3.5–5.0)
Alkaline Phosphatase: 72 U/L (ref 38–126)
Anion gap: 12 (ref 5–15)
BUN: 8 mg/dL (ref 6–20)
CO2: 21 mmol/L — ABNORMAL LOW (ref 22–32)
Calcium: 9.3 mg/dL (ref 8.9–10.3)
Chloride: 100 mmol/L (ref 98–111)
Creatinine, Ser: 0.95 mg/dL (ref 0.44–1.00)
GFR calc Af Amer: >60 mL/min (ref >60)
GFR calc non Af Amer: >60 mL/min (ref >60)
Glucose, Bld: 115 mg/dL — ABNORMAL HIGH (ref 70–99)
Potassium: 3.9 mmol/L (ref 3.5–5.1)
Sodium: 133 mmol/L — ABNORMAL LOW (ref 135–145)
Total Bilirubin: 1.2 mg/dL (ref 0.3–1.2)
Total Protein: 7.8 g/dL (ref 6.5–8.1)

## 2018-12-01 LAB — URINALYSIS, ROUTINE W REFLEX MICROSCOPIC
Bilirubin Urine: NEGATIVE
Glucose, UA: NEGATIVE mg/dL
Ketones, ur: NEGATIVE mg/dL
Leukocytes,Ua: NEGATIVE
Nitrite: NEGATIVE
Protein, ur: 30 mg/dL — AB
RBC / HPF: >50 RBC/hpf — ABNORMAL HIGH (ref 0–5)
Specific Gravity, Urine: 1.021 (ref 1.005–1.030)
pH: 5 (ref 5.0–8.0)

## 2018-12-01 LAB — I-STAT BETA HCG BLOOD, ED (MC, WL, AP ONLY): I-stat hCG, quantitative: <5 m[IU]/mL (ref <5)

## 2018-12-01 LAB — ETHANOL: Alcohol, Ethyl (B): <10 mg/dL (ref <10)

## 2018-12-01 LAB — LIPASE, BLOOD: Lipase: 27 U/L (ref 11–51)

## 2018-12-01 MED ORDER — SODIUM CHLORIDE 0.9 % IV BOLUS
1000.0000 mL | Freq: Once | INTRAVENOUS | Status: AC
  Administered 2018-12-01: 21:00:00 1000 mL via INTRAVENOUS

## 2018-12-01 MED ORDER — ONDANSETRON HCL 4 MG/2ML IJ SOLN
4.0000 mg | Freq: Once | INTRAMUSCULAR | Status: AC
  Administered 2018-12-01: 4 mg via INTRAVENOUS
  Filled 2018-12-01: qty 2

## 2018-12-01 MED ORDER — TRAMADOL HCL 50 MG PO TABS: 50.0000 mg | ORAL_TABLET | Freq: Four times a day (QID) | ORAL | 0 refills | Status: DC | PRN

## 2018-12-01 MED ORDER — ONDANSETRON 4 MG PO TBDP: 4.0000 mg | ORAL_TABLET | Freq: Three times a day (TID) | ORAL | 0 refills | Status: DC | PRN

## 2018-12-01 MED ORDER — KETOROLAC TROMETHAMINE 30 MG/ML IJ SOLN
15.0000 mg | Freq: Once | INTRAMUSCULAR | Status: AC
  Administered 2018-12-01: 21:00:00 15 mg via INTRAVENOUS
  Filled 2018-12-01: qty 1

## 2018-12-01 NOTE — Discharge Instructions (Signed)
As discussed, you have been diagnosed with a kidney stone. To the point that you take ibuprofen, 200 mg, 3 times daily in addition to the breakthrough pain medication as needed, and stay well-hydrated. Return here for concerning changes otherwise, please be sure to follow-up with our urology colleagues. CT scan results from today are included below. Please note to the incidental finding of what is likely a cyst in her pancreas.  This finding should be discussed with your primary care physician for additional evaluation, which can safely be done as an outpatient in the coming weeks.

## 2018-12-01 NOTE — ED Triage Notes (Signed)
Pt arrives with R flank pain wrapping around into her back. Sudden onset 1300 today.

## 2018-12-01 NOTE — ED Notes (Signed)
Patient transported to CT 

## 2018-12-01 NOTE — ED Notes (Signed)
Reviewed d/c instructions with pt, who verbalized understanding and had no outstanding questions. [employer] & pt labels removed and disposed of in shred bin. Pt departed in NAD, refused use of wheelchair.   

## 2018-12-01 NOTE — ED Provider Notes (Signed)
[facility] EMERGENCY DEPARTMENT Provider Note   CSN: [id number] Arrival date & time: 12/01/18  1950    History   Chief Complaint Chief Complaint  Patient presents with   Flank Pain    HPI Michelle Foley is a 28 y.o. female.     HPI Is with concern of right-sided abdominal and flank pain. Onset was relatively severe, sudden about 8 hours prior to ED arrival. There is associated nausea, vomiting initially, but this has improved.  On however, there is ongoing anorexia. Pain is throughout the right side, with a no left-sided pain at all. No absolute dysuria, though there is some oliguria. Patient has history of multiple medical problems, including kidney stones, prior cholecystectomy. Patient attempted to use oxycodone for relief, but had nausea, vomiting soon thereafter.  Past Medical History:  Diagnosis Date   GERD (gastroesophageal reflux disease)    Headache(784.0)    History of acute pancreatitis    History of gonorrhea    History of kidney stones    UTI (lower urinary tract infection)     There are no active problems to display for this patient.   Past Surgical History:  Procedure Laterality Date   CHOLECYSTECTOMY       OB History    Gravida  2   Para  2   Term  2   Preterm      AB      Living  2     SAB      TAB      Ectopic      Multiple      Live Births  2            Home Medications    Prior to Admission medications   Medication Sig Start Date End Date Taking? Authorizing Provider  Prenatal Vit-Fe Fumarate-FA (PRENATAL MULTIVITAMIN) TABS Take 1 tablet by mouth daily.    [provider]    Family History Family History  Problem Relation Age of Onset   Heart attack Mother    Hypertension Mother    Heart attack Maternal Grandfather    Hypertension Maternal Grandfather    Cancer Maternal Grandfather        rectal    Social History Social History   Tobacco Use   Smoking  status: Never Smoker  Substance Use Topics   Alcohol use: No   Drug use: No     Allergies   Amoxicillin-pot clavulanate and Codeine   Review of Systems Review of Systems  Constitutional:       Per HPI, otherwise negative  HENT:       Per HPI, otherwise negative  Respiratory:       Per HPI, otherwise negative  Cardiovascular:       Per HPI, otherwise negative  Gastrointestinal: Positive for nausea and vomiting.  Endocrine:       Negative aside from HPI  Genitourinary:       Neg aside from HPI   Musculoskeletal:       Per HPI, otherwise negative  Skin: Negative.   Neurological: Negative for syncope.     Physical Exam Updated Vital Signs BP 98/71 (BP Location: Left Arm)    Pulse 96    Temp 98 F (36.7 C) (Oral)    Resp 16    Ht 5\' 5"  (1.651 m)    Wt 90.7 kg    SpO2 98%    BMI 33.28 kg/m   Physical Exam Vitals signs and  nursing note reviewed.  Constitutional:      General: She is not in acute distress.    Appearance: She is well-developed.  HENT:     Head: Normocephalic and atraumatic.  Eyes:     Conjunctiva/sclera: Conjunctivae normal.  Cardiovascular:     Rate and Rhythm: Normal rate and regular rhythm.  Pulmonary:     Effort: Pulmonary effort is normal. No respiratory distress.     Breath sounds: Normal breath sounds. No stridor.  Abdominal:     General: There is no distension.     Comments: Mild ttp throughout the R sided abdomen.  No peritonitis  Skin:    General: Skin is warm and dry.  Neurological:     Mental Status: She is alert and oriented to person, place, and time.     Cranial Nerves: No cranial nerve deficit.      ED Treatments / Results  Labs (all labs ordered are listed, but only abnormal results are displayed) Labs Reviewed  COMPREHENSIVE METABOLIC PANEL - Abnormal; Notable for the following components:      Result Value   Sodium 133 (*)    CO2 21 (*)    Glucose, Bld 115 (*)    All other components within normal limits  CBC WITH  DIFFERENTIAL/PLATELET - Abnormal; Notable for the following components:   WBC 13.4 (*)    Neutro Abs 11.6 (*)    All other components within normal limits  URINALYSIS, ROUTINE W REFLEX MICROSCOPIC - Abnormal; Notable for the following components:   Color, Urine AMBER (*)    APPearance CLOUDY (*)    Hgb urine dipstick LARGE (*)    Protein, ur 30 (*)    RBC / HPF >50 (*)    Bacteria, UA RARE (*)    All other components within normal limits  ETHANOL  LIPASE, BLOOD  I-STAT BETA HCG BLOOD, ED ([facility], [facility], [facility] ONLY)    EKG None  Radiology Ct Renal Stone Study  Result Date: 12/01/2018 CLINICAL DATA:  Right flank pain EXAM: CT ABDOMEN AND PELVIS WITHOUT CONTRAST TECHNIQUE: Multidetector CT imaging of the abdomen and pelvis was performed following the standard protocol without IV contrast. COMPARISON:  09/05/2007 FINDINGS: Lower chest: Right basilar atelectasis or scarring. No effusions. Heart is normal size. Hepatobiliary: No focal liver abnormality is seen. Status post cholecystectomy. No biliary dilatation. Pancreas: Low-density area within the pancreatic head measuring approximately 2.9 cm of unknown etiology. This conceivably could represent pancreatic mass or possible pseudo cyst. No ductal dilatation. Spleen: No focal abnormality.  Normal size. Adrenals/Urinary Tract: Moderate right hydronephrosis. 5 mm right UVJ stone. No additional renal or ureteral stones bilaterally. Adrenal glands and urinary bladder unremarkable. Stomach/Bowel: Stomach, large and small bowel grossly unremarkable. Appendix normal. Vascular/Lymphatic: No evidence of aneurysm or adenopathy. Reproductive: Uterus and adnexa unremarkable. No mass. IUD in the uterus. Other: No free fluid or free air. Musculoskeletal: No acute bony abnormality. IMPRESSION: 5 mm right UVJ stone with moderate right hydronephrosis. 2.9 cm low-density area within the pancreatic head of unknown etiology. Recommend further evaluation with nonemergent  pancreatic protocol MRI. Electronically Signed   By: Charlett NoseKevin  Dover M.D.   On: 12/01/2018 22:16    Procedures Procedures (including critical care time)  Medications Ordered in ED Medications  sodium chloride 0.9 % bolus 1,000 mL (1,000 mLs Intravenous New Bag/Given 12/01/18 2101)  ondansetron (ZOFRAN) injection 4 mg (4 mg Intravenous Given 12/01/18 2102)  ketorolac (TORADOL) 30 MG/ML injection 15 mg (15 mg Intravenous Given 12/01/18  2103)     Initial Impression / Assessment and Plan / ED Course  I have reviewed the triage vital signs and the nursing notes.  Pertinent labs & imaging results that were available during my care of the patient were reviewed by me and considered in my medical decision making (see chart for details).        11:20 PM Patient in no distress, awake, alert, speaking clearly on repeat exam.  I reviewed the CT, discussed with her, no evidence for infection or obstruction, the patient is found to have a 5 mm stone. Patient will follow-up with urology, be discharged with analgesia. Patient made aware of incidental findings.  Final Clinical Impressions(s) / ED Diagnoses  Nephrolithiasis   Gerhard Munch, MD 12/01/18 2320

## 2020-12-11 ENCOUNTER — Ambulatory Visit: Payer: Medicaid Other | Admitting: Physical Therapy

## 2023-02-15 ENCOUNTER — Ambulatory Visit: Payer: Medicaid Other

## 2023-02-15 LAB — OB RESULTS CONSOLE HIV ANTIBODY (ROUTINE TESTING): HIV: NONREACTIVE

## 2023-02-15 LAB — OB RESULTS CONSOLE GC/CHLAMYDIA
Chlamydia: POSITIVE
Neisseria Gonorrhea: NEGATIVE

## 2023-02-15 LAB — OB RESULTS CONSOLE RUBELLA ANTIBODY, IGM: Rubella: IMMUNE

## 2023-02-15 LAB — HEPATITIS C ANTIBODY: HCV Ab: NEGATIVE

## 2023-02-15 LAB — OB RESULTS CONSOLE HEPATITIS B SURFACE ANTIGEN: Hepatitis B Surface Ag: NEGATIVE

## 2023-03-12 LAB — OB RESULTS CONSOLE GC/CHLAMYDIA
Chlamydia: NEGATIVE
Neisseria Gonorrhea: NEGATIVE

## 2023-04-17 ENCOUNTER — Other Ambulatory Visit: Payer: Self-pay | Admitting: Obstetrics and Gynecology

## 2023-04-17 DIAGNOSIS — O99210 Obesity complicating pregnancy, unspecified trimester: Secondary | ICD-10-CM

## 2023-04-18 ENCOUNTER — Other Ambulatory Visit: Payer: Self-pay

## 2023-05-04 ENCOUNTER — Encounter: Payer: Self-pay | Admitting: *Deleted

## 2023-05-04 DIAGNOSIS — O99210 Obesity complicating pregnancy, unspecified trimester: Secondary | ICD-10-CM | POA: Insufficient documentation

## 2023-05-11 ENCOUNTER — Other Ambulatory Visit: Payer: Self-pay | Admitting: *Deleted

## 2023-05-11 ENCOUNTER — Encounter: Payer: Self-pay | Admitting: *Deleted

## 2023-05-11 ENCOUNTER — Ambulatory Visit: Payer: Medicaid Other | Admitting: *Deleted

## 2023-05-11 ENCOUNTER — Ambulatory Visit: Payer: Medicaid Other | Attending: Obstetrics and Gynecology

## 2023-05-11 VITALS — BP 117/66 | HR 91

## 2023-05-11 DIAGNOSIS — E669 Obesity, unspecified: Secondary | ICD-10-CM

## 2023-05-11 DIAGNOSIS — O99210 Obesity complicating pregnancy, unspecified trimester: Secondary | ICD-10-CM | POA: Insufficient documentation

## 2023-05-11 DIAGNOSIS — O99212 Obesity complicating pregnancy, second trimester: Secondary | ICD-10-CM | POA: Diagnosis not present

## 2023-05-11 DIAGNOSIS — Z3A20 20 weeks gestation of pregnancy: Secondary | ICD-10-CM

## 2023-05-11 DIAGNOSIS — Z362 Encounter for other antenatal screening follow-up: Secondary | ICD-10-CM

## 2023-06-08 ENCOUNTER — Ambulatory Visit: Payer: Medicaid Other | Attending: Obstetrics

## 2023-06-08 ENCOUNTER — Other Ambulatory Visit: Payer: Self-pay

## 2023-06-08 ENCOUNTER — Other Ambulatory Visit: Payer: Self-pay | Admitting: *Deleted

## 2023-06-08 DIAGNOSIS — O99212 Obesity complicating pregnancy, second trimester: Secondary | ICD-10-CM | POA: Diagnosis present

## 2023-06-08 DIAGNOSIS — E669 Obesity, unspecified: Secondary | ICD-10-CM | POA: Diagnosis not present

## 2023-06-08 DIAGNOSIS — Z3A24 24 weeks gestation of pregnancy: Secondary | ICD-10-CM | POA: Diagnosis not present

## 2023-06-08 DIAGNOSIS — Z362 Encounter for other antenatal screening follow-up: Secondary | ICD-10-CM | POA: Insufficient documentation

## 2023-07-05 ENCOUNTER — Ambulatory Visit: Payer: Medicaid Other | Attending: Obstetrics and Gynecology

## 2023-07-05 ENCOUNTER — Other Ambulatory Visit: Payer: Self-pay | Admitting: *Deleted

## 2023-07-05 ENCOUNTER — Other Ambulatory Visit: Payer: Self-pay

## 2023-07-05 DIAGNOSIS — Z362 Encounter for other antenatal screening follow-up: Secondary | ICD-10-CM

## 2023-07-05 DIAGNOSIS — Z3A28 28 weeks gestation of pregnancy: Secondary | ICD-10-CM | POA: Diagnosis not present

## 2023-07-05 DIAGNOSIS — O99213 Obesity complicating pregnancy, third trimester: Secondary | ICD-10-CM

## 2023-07-05 DIAGNOSIS — E669 Obesity, unspecified: Secondary | ICD-10-CM | POA: Diagnosis not present

## 2023-07-05 DIAGNOSIS — O99212 Obesity complicating pregnancy, second trimester: Secondary | ICD-10-CM | POA: Insufficient documentation

## 2023-08-01 NOTE — L&D Delivery Note (Signed)
Delivery Note    Patient Name: Michelle Foley DOB: 1990/11/26 MRN: [id number]  Date of admission: 09/01/2023 Delivering MD: Dale Butler Date of delivery: 09/02/2023 Type of delivery: SVD  Newborn Data: Live born female  Birth Weight:   APGAR: ,   Newborn Delivery   Birth date/time: 09/02/2023 01:00:00 Delivery type: Vaginal, Spontaneous    Michelle Foley, 33 y.o., @ [redacted]w[redacted]d,  [id number], who was admitted for IOL GHTN, GBS-, progress quickly with pitocin. I was called to the room when she progressed +4 station in the second stage of labor.  She involuntary pushed for 1/min.  She delivered a viable infant with RN, cephalic and restituted to the ROA position over an intact perineum.  A nuchal cord   was not identified. The baby was placed on maternal abdomen while initial step of NRP were perfmored (Dry, Stimulated, and warmed). Hat placed on baby for thermoregulation. Delayed cord clamping was performed for 2 minutes.  Cord double clamped and cut.  Cord cut by GMOB. Apgar scores were 9 and 9. Prophylactic Pitocin was started in the third stage of labor for active management. The placenta delivered spontaneously, shultz, with a 3 vessel cord and was sent to LD.  Inspection revealed none. An examination of the vaginal vault and cervix was free from lacerations. The uterus was firm, bleeding stable.   Placenta and umbilical artery blood gas were not sent.  There were no complications during the procedure.  Mom and baby skin to skin following delivery. Left in stable condition. BP 133/88   Pulse 60   Temp 97.8 F (36.6 C) (Axillary)   Resp 19   Ht 5\' 5"  (1.651 m)   Wt 116.8 kg   LMP 12/16/2022   SpO2 95%   BMI 42.87 kg/m    Maternal Info: Anesthesia: None Episiotomy: no Lacerations:  no Suture Repair: no Est. Blood Loss (mL):   Newborn Info:  Baby Sex: female Circumcision: in pt desired Babies Name: Elijah APGAR (1 MIN):   APGAR (5 MINS):   APGAR (10 MINS):     Mom to  postpartum.  Baby to Couplet care / Skin to Skin.  Delivery Report:    Review the Delivery Report for details.   [facility] CNM, FNP-C, PMHNP-BC  [address]  [place], [place] [address]  Cell: [phone]  Office Phone: [phone] Fax: [phone] 09/02/2023  1:22 AM

## 2023-08-03 ENCOUNTER — Ambulatory Visit: Payer: Medicaid Other | Attending: Obstetrics and Gynecology

## 2023-08-03 DIAGNOSIS — E669 Obesity, unspecified: Secondary | ICD-10-CM

## 2023-08-03 DIAGNOSIS — Z3A32 32 weeks gestation of pregnancy: Secondary | ICD-10-CM

## 2023-08-03 DIAGNOSIS — O99212 Obesity complicating pregnancy, second trimester: Secondary | ICD-10-CM | POA: Diagnosis present

## 2023-08-03 DIAGNOSIS — O99213 Obesity complicating pregnancy, third trimester: Secondary | ICD-10-CM

## 2023-08-16 ENCOUNTER — Ambulatory Visit: Payer: Medicaid Other | Attending: Obstetrics and Gynecology

## 2023-08-16 ENCOUNTER — Other Ambulatory Visit: Payer: Self-pay | Admitting: Obstetrics and Gynecology

## 2023-08-16 ENCOUNTER — Other Ambulatory Visit: Payer: Self-pay

## 2023-08-16 DIAGNOSIS — E669 Obesity, unspecified: Secondary | ICD-10-CM

## 2023-08-16 DIAGNOSIS — Z362 Encounter for other antenatal screening follow-up: Secondary | ICD-10-CM

## 2023-08-16 DIAGNOSIS — O99213 Obesity complicating pregnancy, third trimester: Secondary | ICD-10-CM | POA: Diagnosis not present

## 2023-08-16 DIAGNOSIS — Z3A34 34 weeks gestation of pregnancy: Secondary | ICD-10-CM | POA: Diagnosis not present

## 2023-08-23 ENCOUNTER — Ambulatory Visit: Payer: Medicaid Other | Attending: Obstetrics and Gynecology

## 2023-08-23 ENCOUNTER — Other Ambulatory Visit: Payer: Self-pay

## 2023-08-23 DIAGNOSIS — O99213 Obesity complicating pregnancy, third trimester: Secondary | ICD-10-CM | POA: Diagnosis not present

## 2023-08-23 DIAGNOSIS — Z362 Encounter for other antenatal screening follow-up: Secondary | ICD-10-CM

## 2023-08-23 DIAGNOSIS — Z3A35 35 weeks gestation of pregnancy: Secondary | ICD-10-CM | POA: Diagnosis not present

## 2023-08-23 DIAGNOSIS — E669 Obesity, unspecified: Secondary | ICD-10-CM

## 2023-08-23 LAB — OB RESULTS CONSOLE GBS: GBS: NEGATIVE

## 2023-08-27 ENCOUNTER — Inpatient Hospital Stay (HOSPITAL_COMMUNITY)
Admission: AD | Admit: 2023-08-27 | Discharge: 2023-08-27 | Disposition: A | Discharge status: Home / Self Care | Payer: Medicaid Other | Attending: Obstetrics and Gynecology | Admitting: Obstetrics and Gynecology

## 2023-08-27 ENCOUNTER — Encounter (HOSPITAL_COMMUNITY): Payer: Self-pay | Admitting: Obstetrics and Gynecology

## 2023-08-27 ENCOUNTER — Other Ambulatory Visit: Payer: Self-pay

## 2023-08-27 DIAGNOSIS — Z3A36 36 weeks gestation of pregnancy: Secondary | ICD-10-CM | POA: Insufficient documentation

## 2023-08-27 DIAGNOSIS — R1011 Right upper quadrant pain: Secondary | ICD-10-CM | POA: Insufficient documentation

## 2023-08-27 DIAGNOSIS — O26893 Other specified pregnancy related conditions, third trimester: Secondary | ICD-10-CM | POA: Diagnosis present

## 2023-08-27 DIAGNOSIS — R112 Nausea with vomiting, unspecified: Secondary | ICD-10-CM

## 2023-08-27 DIAGNOSIS — R1012 Left upper quadrant pain: Secondary | ICD-10-CM | POA: Insufficient documentation

## 2023-08-27 DIAGNOSIS — R197 Diarrhea, unspecified: Secondary | ICD-10-CM | POA: Diagnosis not present

## 2023-08-27 DIAGNOSIS — O133 Gestational [pregnancy-induced] hypertension without significant proteinuria, third trimester: Secondary | ICD-10-CM | POA: Insufficient documentation

## 2023-08-27 DIAGNOSIS — R1010 Upper abdominal pain, unspecified: Secondary | ICD-10-CM

## 2023-08-27 DIAGNOSIS — R110 Nausea: Secondary | ICD-10-CM | POA: Diagnosis not present

## 2023-08-27 LAB — URINALYSIS, ROUTINE W REFLEX MICROSCOPIC
Bilirubin Urine: NEGATIVE
Glucose, UA: NEGATIVE mg/dL
Hgb urine dipstick: NEGATIVE
Ketones, ur: NEGATIVE mg/dL
Leukocytes,Ua: NEGATIVE
Nitrite: NEGATIVE
Protein, ur: 30 mg/dL — AB
Specific Gravity, Urine: 1.029 (ref 1.005–1.030)
pH: 5 (ref 5.0–8.0)

## 2023-08-27 LAB — COMPREHENSIVE METABOLIC PANEL
ALT: 16 U/L (ref 0–44)
AST: 19 U/L (ref 15–41)
Albumin: 2.5 g/dL — ABNORMAL LOW (ref 3.5–5.0)
Alkaline Phosphatase: 179 U/L — ABNORMAL HIGH (ref 38–126)
Anion gap: 10 (ref 5–15)
BUN: 13 mg/dL (ref 6–20)
CO2: 18 mmol/L — ABNORMAL LOW (ref 22–32)
Calcium: 9 mg/dL (ref 8.9–10.3)
Chloride: 107 mmol/L (ref 98–111)
Creatinine, Ser: 0.69 mg/dL (ref 0.44–1.00)
GFR, Estimated: >60 mL/min (ref >60)
Glucose, Bld: 93 mg/dL (ref 70–99)
Potassium: 3.7 mmol/L (ref 3.5–5.1)
Sodium: 135 mmol/L (ref 135–145)
Total Bilirubin: 0.6 mg/dL (ref 0.0–1.2)
Total Protein: 6.5 g/dL (ref 6.5–8.1)

## 2023-08-27 LAB — CBC
HCT: 36.1 % (ref 36.0–46.0)
Hemoglobin: 12.1 g/dL (ref 12.0–15.0)
MCH: 28.8 pg (ref 26.0–34.0)
MCHC: 33.5 g/dL (ref 30.0–36.0)
MCV: 86 fL (ref 80.0–100.0)
Platelets: 227 10*3/uL (ref 150–400)
RBC: 4.2 MIL/uL (ref 3.87–5.11)
RDW: 13.2 % (ref 11.5–15.5)
WBC: 11.1 10*3/uL — ABNORMAL HIGH (ref 4.0–10.5)
nRBC: 0 % (ref 0.0–0.2)

## 2023-08-27 LAB — PROTEIN / CREATININE RATIO, URINE
Creatinine, Urine: 200 mg/dL
Protein Creatinine Ratio: 0.16 mg/mg{creat} — ABNORMAL HIGH (ref 0.00–0.15)
Total Protein, Urine: 31 mg/dL

## 2023-08-27 MED ORDER — ONDANSETRON 4 MG PO TBDP: 4.0000 mg | ORAL_TABLET | Freq: Four times a day (QID) | ORAL | 0 refills | Status: DC | PRN

## 2023-08-27 MED ORDER — ONDANSETRON 4 MG PO TBDP
4.0000 mg | ORAL_TABLET | Freq: Once | ORAL | Status: AC
  Administered 2023-08-27: 4 mg via ORAL
  Filled 2023-08-27: qty 1

## 2023-08-27 NOTE — MAU Note (Signed)
Michelle Foley is a 33 y.o. at [redacted]w[redacted]d here in MAU reporting: reports abdominal cramping, nausea, diarrhea, and intermittent belching that began yesterday. Endorses +FM, Denies VB and LOF.    Onset of complaint: yesterday  Pain score: abd 6/10  Vitals:   08/27/23 2119  BP: (!) 142/78  Pulse: 90  Resp: 18  Temp: 98.7 F (37.1 C)     FHT: 155  Lab orders placed from triage: UA

## 2023-08-27 NOTE — MAU Provider Note (Signed)
Chief Complaint:  Abdominal Pain, Nausea, and Diarrhea   Event Date/Time   First Provider Initiated Contact with Patient 08/27/23 2140     HPI: Michelle Foley is a 33 y.o. G3P2002 at 48w2dwho presents to maternity admissions reporting nausea, diarrhea, upper abdominal pain and belching.  Denies fever or chills. No one else has been sick. . She reports good fetal movement, denies LOF, vaginal bleeding, constipation or fever/chills.    Abdominal Pain This is a new problem. The current episode started yesterday. The problem has been unchanged. The pain is located in the LUQ, RUQ and epigastric region. The quality of the pain is cramping. The abdominal pain does not radiate. Associated symptoms include diarrhea and nausea. Pertinent negatives include no fever, headaches, myalgias or vomiting. Nothing aggravates the pain. The pain is relieved by Nothing. She has tried nothing for the symptoms.  Diarrhea  This is a new problem. The current episode started yesterday. Associated symptoms include abdominal pain. Pertinent negatives include no chills, fever, headaches, myalgias or vomiting. There are no known risk factors. She has tried nothing for the symptoms.   RN Note:  Michelle Foley is a 33 y.o. at [redacted]w[redacted]d here in MAU reporting: reports abdominal cramping, nausea, diarrhea, and intermittent belching that began yesterday. Endorses +FM, Denies VB and LOF.   Onset of complaint: yesterday  Pain score: abd 6/10     Past Medical History: Past Medical History:  Diagnosis Date   Chlamydia    GERD (gastroesophageal reflux disease)    Headache(784.0)    History of acute pancreatitis    History of gonorrhea    History of kidney stones    Kidney stone    UTI (lower urinary tract infection)    Vitamin D deficiency     Past obstetric history: OB History  Gravida Para Term Preterm AB Living  3 2 2   2   SAB IAB Ectopic Multiple Live Births      2    # Outcome Date GA Lbr Len/2nd Weight Sex  Type Anes PTL Lv  3 Current           2 Term 09/15/11 [redacted]w[redacted]d 03:32 / 00:14 3181 g M Vag-Spont Local, EPI  LIV  1 Term 2011 [redacted]w[redacted]d 14:00 2948 g M Vag-Spont EPI  LIV     Birth Comments: cholethiasis, lap chole, pancreatitis at 24 wks    Past Surgical History: Past Surgical History:  Procedure Laterality Date   CHOLECYSTECTOMY      Family History: Family History  Problem Relation Age of Onset   Heart attack Mother    Hypertension Mother    Heart attack Maternal Grandfather    Hypertension Maternal Grandfather    Cancer Maternal Grandfather        rectal    Social History: Social History   Tobacco Use   Smoking status: Never  Vaping Use   Vaping status: Never Used  Substance Use Topics   Alcohol use: No   Drug use: No    Allergies:  Allergies  Allergen Reactions   Amoxicillin-Pot Clavulanate Nausea And Vomiting   Codeine     Meds:  Medications Prior to Admission  Medication Sig Dispense Refill Last Dose/Taking   Cholecalciferol (VITAMIN D3) 50 MCG (2000 UT) TABS Take 50 mcg by mouth daily.   08/26/2023   pantoprazole (PROTONIX) 40 MG tablet Take 40 mg by mouth daily.   08/27/2023 at  6:30 AM   Prenatal Vit-Fe Fumarate-FA (PRENATAL MULTIVITAMIN) TABS Take 1  tablet by mouth daily.   08/26/2023   promethazine (PHENERGAN) 12.5 MG tablet Take 12.5 mg by mouth every 6 (six) hours as needed for nausea or vomiting.   08/27/2023 at  6:30 PM   ondansetron (ZOFRAN ODT) 4 MG disintegrating tablet Take 1 tablet (4 mg total) by mouth every 8 (eight) hours as needed for nausea or vomiting. 20 tablet 0    traMADol (ULTRAM) 50 MG tablet Take 1 tablet (50 mg total) by mouth every 6 (six) hours as needed. 15 tablet 0    VITAMIN D PO Take by mouth.       I have reviewed patient's Past Medical Hx, Surgical Hx, Family Hx, Social Hx, medications and allergies.   ROS:  Review of Systems  Constitutional:  Negative for chills and fever.  Gastrointestinal:  Positive for abdominal pain,  diarrhea and nausea. Negative for vomiting.  Musculoskeletal:  Negative for myalgias.  Neurological:  Negative for headaches.   Other systems negative  Physical Exam  Patient Vitals for the past 24 hrs:  BP Temp Temp src Pulse Resp Height Weight  08/27/23 2134 139/80 -- -- 85 -- -- --  08/27/23 2132 (!) 139/92 -- -- 80 -- -- --  08/27/23 2119 (!) 142/78 98.7 F (37.1 C) Oral 90 18 -- --  08/27/23 2110 -- -- -- -- -- 5\' 5"  (1.651 m) 117.3 kg   Constitutional: Well-developed, well-nourished female in no acute distress.  Cardiovascular: normal rate and rhythm Respiratory: normal effort, clear to auscultation bilaterally GI: Abd soft, mildly tender over upper abdomen, gravid appropriate for gestational age.   No rebound or guarding. MS: Extremities nontender, no edema, normal ROM Neurologic: Alert and oriented x 4.  GU: Neg CVAT.   FHT:  Baseline 130 , moderate variability, accelerations present, no decelerations Contractions:  Irregular     Labs: Results for orders placed or performed during the hospital encounter of 08/27/23 (from the past 24 hours)  Urinalysis, Routine w reflex microscopic -Urine, Clean Catch     Status: Abnormal   Collection Time: 08/27/23  9:30 PM  Result Value Ref Range   Color, Urine AMBER (A) YELLOW   APPearance CLOUDY (A) CLEAR   Specific Gravity, Urine 1.029 1.005 - 1.030   pH 5.0 5.0 - 8.0   Glucose, UA NEGATIVE NEGATIVE mg/dL   Hgb urine dipstick NEGATIVE NEGATIVE   Bilirubin Urine NEGATIVE NEGATIVE   Ketones, ur NEGATIVE NEGATIVE mg/dL   Protein, ur 30 (A) NEGATIVE mg/dL   Nitrite NEGATIVE NEGATIVE   Leukocytes,Ua NEGATIVE NEGATIVE   RBC / HPF 0-5 0 - 5 RBC/hpf   WBC, UA 0-5 0 - 5 WBC/hpf   Bacteria, UA MANY (A) NONE SEEN   Squamous Epithelial / HPF 11-20 0 - 5 /HPF   Mucus PRESENT    Ca Oxalate Crys, UA PRESENT   Protein / creatinine ratio, urine     Status: Abnormal   Collection Time: 08/27/23  9:30 PM  Result Value Ref Range    Creatinine, Urine 200 mg/dL   Total Protein, Urine 31 mg/dL   Protein Creatinine Ratio 0.16 (H) 0.00 - 0.15 mg/mg[Cre]  CBC     Status: Abnormal   Collection Time: 08/27/23  9:58 PM  Result Value Ref Range   WBC 11.1 (H) 4.0 - 10.5 K/uL   RBC 4.20 3.87 - 5.11 MIL/uL   Hemoglobin 12.1 12.0 - 15.0 g/dL   HCT 40.9 81.1 - 91.4 %   MCV 86.0 80.0 - 100.0 fL  MCH 28.8 26.0 - 34.0 pg   MCHC 33.5 30.0 - 36.0 g/dL   RDW 16.1 09.6 - 04.5 %   Platelets 227 150 - 400 K/uL   nRBC 0.0 0.0 - 0.2 %  Comprehensive metabolic panel     Status: Abnormal   Collection Time: 08/27/23  9:58 PM  Result Value Ref Range   Sodium 135 135 - 145 mmol/L   Potassium 3.7 3.5 - 5.1 mmol/L   Chloride 107 98 - 111 mmol/L   CO2 18 (L) 22 - 32 mmol/L   Glucose, Bld 93 70 - 99 mg/dL   BUN 13 6 - 20 mg/dL   Creatinine, Ser 4.09 0.44 - 1.00 mg/dL   Calcium 9.0 8.9 - 81.1 mg/dL   Total Protein 6.5 6.5 - 8.1 g/dL   Albumin 2.5 (L) 3.5 - 5.0 g/dL   AST 19 15 - 41 U/L   ALT 16 0 - 44 U/L   Alkaline Phosphatase 179 (H) 38 - 126 U/L   Total Bilirubin 0.6 0.0 - 1.2 mg/dL   GFR, Estimated >91 >47 mL/min   Anion gap 10 5 - 15       Imaging:    MAU Course/MDM: I have reviewed the triage vital signs and the nursing notes.   Pertinent labs & imaging results that were available during my care of the patient were reviewed by me and considered in my medical decision making (see chart for details).      I have reviewed her medical records including past results, notes and treatments.   I have ordered labs and reviewed results. These were normal  NST reviewed  Treatments in MAU included zofran for nausea which did help Discussed usual course and treatment for viral gastroenteritis. .    Assessment: Single IUP at [redacted]w[redacted]d Upper abdominal pain Nausea Diarrhea  Plan: Discharge home New Rx for Zofran for nausea Labor precautions and fetal kick counts Follow up in Office for prenatal visits and recheck Encouraged to  return if she develops worsening of symptoms, increase in pain, fever, or other concerning symptoms.   Pt stable at time of discharge.  Wynelle Bourgeois CNM, MSN Certified Nurse-Midwife 08/27/2023 9:40 PM

## 2023-08-30 ENCOUNTER — Other Ambulatory Visit: Payer: Self-pay

## 2023-08-30 ENCOUNTER — Ambulatory Visit: Payer: Medicaid Other | Attending: Obstetrics and Gynecology

## 2023-08-30 ENCOUNTER — Ambulatory Visit (HOSPITAL_BASED_OUTPATIENT_CLINIC_OR_DEPARTMENT_OTHER): Payer: Medicaid Other | Admitting: Maternal & Fetal Medicine

## 2023-08-30 DIAGNOSIS — O133 Gestational [pregnancy-induced] hypertension without significant proteinuria, third trimester: Secondary | ICD-10-CM

## 2023-08-30 DIAGNOSIS — Z362 Encounter for other antenatal screening follow-up: Secondary | ICD-10-CM | POA: Insufficient documentation

## 2023-08-30 DIAGNOSIS — E669 Obesity, unspecified: Secondary | ICD-10-CM | POA: Diagnosis not present

## 2023-08-30 DIAGNOSIS — Z3A36 36 weeks gestation of pregnancy: Secondary | ICD-10-CM

## 2023-08-30 DIAGNOSIS — O99213 Obesity complicating pregnancy, third trimester: Secondary | ICD-10-CM | POA: Diagnosis present

## 2023-08-30 DIAGNOSIS — O139 Gestational [pregnancy-induced] hypertension without significant proteinuria, unspecified trimester: Secondary | ICD-10-CM | POA: Insufficient documentation

## 2023-08-30 NOTE — Progress Notes (Signed)
   Patient information  Patient Name: Michelle Foley  Patient MRN:   [id number]  Referring practice: MFM Referring Provider: [facility])  MFM CONSULT  Michelle Foley is a 33 y.o. [id number] at [redacted]w[redacted]d here for ultrasound and consultation. Patient Active Problem List   Diagnosis Date Noted   Gestational hypertension 08/30/2023   Obesity affecting pregnancy, antepartum 05/04/2023    Michelle Foley is doing well today with no acute concerns. She denies contractions, bleeding, or loss of fluid and reports good fetal movement.   RE gestational hypertension: Patient was recently diagnosed with gestational hypertension.  Today her blood pressure is 133/84.  She occasionally has headaches but this is not new.  She has headaches outside of pregnancy.  She reports that she does not have a headache currently.  I gave her preeclampsia precautions and instructed her on delivery timing.  To avoid progression to preeclampsia it is advisable she delivers at 37 weeks.  Sonographic findings Single intrauterine pregnancy. Fetal cardiac activity: Observed. Presentation: Cephalic. Interval fetal anatomy appears normal Fetal biometry shows the estimated fetal weight at the 51 percentile. Amniotic fluid: Within normal limits.  MVP: 3.51 cm. Placenta: Anterior. BPP: 8/8.   Recommendations - Delivery at 37w - Preeclampsia precautions given  There are limitations of prenatal ultrasound such as the inability to detect certain abnormalities due to poor visualization. Various factors such as fetal position, gestational age and maternal body habitus may increase the difficulty in visualizing the fetal anatomy.    Review of Systems: A review of systems was performed and was negative except per HPI   Vitals and Physical Exam    08/30/2023    2:53 PM 08/30/2023    2:26 PM 08/27/2023   11:16 PM  Vitals with BMI  Systolic 133 147 962  Diastolic 84 85 83  Pulse   76    Sitting comfortably  on the sonogram table Nonlabored breathing Normal rate and rhythm Abdomen is nontender  Past pregnancies OB History  Gravida Para Term Preterm AB Living  3 2 2   2   SAB IAB Ectopic Multiple Live Births      2    # Outcome Date GA Lbr Len/2nd Weight Sex Type Anes PTL Lv  3 Current           2 Term 09/15/11 [redacted]w[redacted]d 03:32 / 00:14 7 lb 0.2 oz (3.181 kg) M Vag-Spont Local, EPI  LIV  1 Term 2011 [redacted]w[redacted]d 14:00 6 lb 8 oz (2.948 kg) M Vag-Spont EPI  LIV     Birth Comments: cholethiasis, lap chole, pancreatitis at 24 wks     I spent 20 minutes reviewing the patients chart, including labs and images as well as counseling the patient about her medical conditions. Greater than 50% of the time was spent in direct face-to-face patient counseling.  Braxton Feathers  MFM, [place]   08/30/2023  3:24 PM

## 2023-08-31 ENCOUNTER — Other Ambulatory Visit: Payer: Self-pay | Admitting: Obstetrics & Gynecology

## 2023-09-01 ENCOUNTER — Encounter (HOSPITAL_COMMUNITY): Payer: Self-pay | Admitting: Obstetrics & Gynecology

## 2023-09-01 ENCOUNTER — Inpatient Hospital Stay (HOSPITAL_COMMUNITY): Payer: Medicaid Other

## 2023-09-01 ENCOUNTER — Inpatient Hospital Stay (HOSPITAL_COMMUNITY)
Admission: AD | Admit: 2023-09-01 | Discharge: 2023-09-03 | DRG: 807 | Disposition: A | Discharge status: Home / Self Care | Payer: Medicaid Other | Attending: Obstetrics & Gynecology | Admitting: Obstetrics & Gynecology

## 2023-09-01 DIAGNOSIS — Z87442 Personal history of urinary calculi: Secondary | ICD-10-CM

## 2023-09-01 DIAGNOSIS — Z8249 Family history of ischemic heart disease and other diseases of the circulatory system: Secondary | ICD-10-CM

## 2023-09-01 DIAGNOSIS — Z3A37 37 weeks gestation of pregnancy: Secondary | ICD-10-CM

## 2023-09-01 DIAGNOSIS — O134 Gestational [pregnancy-induced] hypertension without significant proteinuria, complicating childbirth: Secondary | ICD-10-CM | POA: Diagnosis present

## 2023-09-01 DIAGNOSIS — O99214 Obesity complicating childbirth: Secondary | ICD-10-CM | POA: Diagnosis present

## 2023-09-01 DIAGNOSIS — Z3490 Encounter for supervision of normal pregnancy, unspecified, unspecified trimester: Secondary | ICD-10-CM | POA: Diagnosis present

## 2023-09-01 DIAGNOSIS — O139 Gestational [pregnancy-induced] hypertension without significant proteinuria, unspecified trimester: Principal | ICD-10-CM | POA: Diagnosis present

## 2023-09-01 LAB — COMPREHENSIVE METABOLIC PANEL
ALT: 16 U/L (ref 0–44)
AST: 18 U/L (ref 15–41)
Albumin: 2.4 g/dL — ABNORMAL LOW (ref 3.5–5.0)
Alkaline Phosphatase: 224 U/L — ABNORMAL HIGH (ref 38–126)
Anion gap: 12 (ref 5–15)
BUN: 8 mg/dL (ref 6–20)
CO2: 19 mmol/L — ABNORMAL LOW (ref 22–32)
Calcium: 8.7 mg/dL — ABNORMAL LOW (ref 8.9–10.3)
Chloride: 105 mmol/L (ref 98–111)
Creatinine, Ser: 0.72 mg/dL (ref 0.44–1.00)
GFR, Estimated: >60 mL/min (ref >60)
Glucose, Bld: 76 mg/dL (ref 70–99)
Potassium: 4.2 mmol/L (ref 3.5–5.1)
Sodium: 136 mmol/L (ref 135–145)
Total Bilirubin: 0.7 mg/dL (ref 0.0–1.2)
Total Protein: 6 g/dL — ABNORMAL LOW (ref 6.5–8.1)

## 2023-09-01 LAB — PROTEIN / CREATININE RATIO, URINE
Creatinine, Urine: 195 mg/dL
Protein Creatinine Ratio: 0.1 mg/mg{creat} (ref 0.00–0.15)
Total Protein, Urine: 19 mg/dL

## 2023-09-01 LAB — RPR: RPR Ser Ql: NONREACTIVE

## 2023-09-01 LAB — CBC
HCT: 34.3 % — ABNORMAL LOW (ref 36.0–46.0)
Hemoglobin: 11.6 g/dL — ABNORMAL LOW (ref 12.0–15.0)
MCH: 28.9 pg (ref 26.0–34.0)
MCHC: 33.8 g/dL (ref 30.0–36.0)
MCV: 85.5 fL (ref 80.0–100.0)
Platelets: 213 10*3/uL (ref 150–400)
RBC: 4.01 MIL/uL (ref 3.87–5.11)
RDW: 13.4 % (ref 11.5–15.5)
WBC: 8.2 10*3/uL (ref 4.0–10.5)
nRBC: 0 % (ref 0.0–0.2)

## 2023-09-01 LAB — TYPE AND SCREEN
ABO/RH(D): O POS
Antibody Screen: NEGATIVE

## 2023-09-01 MED ORDER — MISOPROSTOL 25 MCG QUARTER TABLET
25.0000 ug | ORAL_TABLET | ORAL | Status: DC | PRN
  Administered 2023-09-01 (×2): 25 ug via VAGINAL
  Filled 2023-09-01 (×2): qty 1

## 2023-09-01 MED ORDER — SOD CITRATE-CITRIC ACID 500-334 MG/5ML PO SOLN: 30.0000 mL | ORAL | Status: DC | PRN

## 2023-09-01 MED ORDER — FENTANYL CITRATE (PF) 100 MCG/2ML IJ SOLN
100.0000 ug | INTRAMUSCULAR | Status: DC | PRN
  Administered 2023-09-01: 100 ug via INTRAVENOUS
  Filled 2023-09-01: qty 2

## 2023-09-01 MED ORDER — ACETAMINOPHEN 325 MG PO TABS: 650.0000 mg | ORAL_TABLET | ORAL | Status: DC | PRN

## 2023-09-01 MED ORDER — ONDANSETRON HCL 4 MG/2ML IJ SOLN
4.0000 mg | Freq: Four times a day (QID) | INTRAMUSCULAR | Status: DC | PRN
  Administered 2023-09-02: 4 mg via INTRAVENOUS
  Filled 2023-09-01: qty 2

## 2023-09-01 MED ORDER — TERBUTALINE SULFATE 1 MG/ML IJ SOLN
0.2500 mg | Freq: Once | INTRAMUSCULAR | Status: DC | PRN
Start: 2023-09-01 — End: 2023-09-02

## 2023-09-01 MED ORDER — LACTATED RINGERS IV SOLN: INTRAVENOUS | Status: DC

## 2023-09-01 MED ORDER — OXYTOCIN-SODIUM CHLORIDE 30-0.9 UT/500ML-% IV SOLN
1.0000 m[IU]/min | INTRAVENOUS | Status: DC
  Administered 2023-09-01: 2 m[IU]/min via INTRAVENOUS
  Filled 2023-09-01: qty 500

## 2023-09-01 MED ORDER — OXYTOCIN BOLUS FROM INFUSION
333.0000 mL | Freq: Once | INTRAVENOUS | Status: AC
  Administered 2023-09-02: 333 mL via INTRAVENOUS

## 2023-09-01 MED ORDER — LIDOCAINE HCL (PF) 1 % IJ SOLN: 30.0000 mL | INTRAMUSCULAR | Status: DC | PRN

## 2023-09-01 MED ORDER — LACTATED RINGERS IV SOLN
500.0000 mL | INTRAVENOUS | Status: DC | PRN
Start: 2023-09-01 — End: 2023-09-02
  Administered 2023-09-02: 500 mL via INTRAVENOUS

## 2023-09-01 MED ORDER — OXYTOCIN-SODIUM CHLORIDE 30-0.9 UT/500ML-% IV SOLN: 2.5000 [IU]/h | INTRAVENOUS | Status: DC

## 2023-09-01 NOTE — Progress Notes (Signed)
   Labor Progress Note  Michelle Foley, 33 y.o., [id number], with an IUP @ [redacted]w[redacted]d, presented for induction of labor for gestational HTN, based on mild range elevated blood pressures at MFM and MAU and with normal preeclampsia labs. GBS-.  Subjective: I assumed care for pt in NAD, s/p 2 cytotec, and feeling mild cxt, pt plan for dinner break then will resume IOL most likely with pitocin. Last cytotec was place 4 hours ago, cat 1 strip  Patient Active Problem List   Diagnosis Date Noted   Gestational hypertension without significant proteinuria 09/01/2023   Gestational hypertension 08/30/2023   Obesity affecting pregnancy, antepartum 05/04/2023   Objective: BP 129/79   Pulse 75   Temp 98.3 F (36.8 C) (Oral)   Resp 18   Ht 5\' 5"  (1.651 m)   Wt 116.8 kg   LMP 12/16/2022   SpO2 95%   BMI 42.87 kg/m  No intake/output data recorded. No intake/output data recorded. NST: FHR baseline 125 bpm, Variability: moderate, Accelerations:present, Decelerations:  Absent= Cat 1/Reactive CTX:  irregular, every 1-2.5 minutes Uterus gravid, soft non tender, moderate to palpate with contractions.  SVE:  Dilation: 2.5 Effacement (%): 50, 60 Station: -3 Exam by:: Amado Nash, RN Pitocin at 66mUn/min  Assessment:  Michelle Foley, 33 y.o., [phone], with an IUP @ [redacted]w[redacted]d, presented for induction of labor for gestational HTN, based on mild range elevated blood pressures at MFM and MAU and with normal preeclampsia labs. GBS-. Progressing in early labor.  Patient Active Problem List   Diagnosis Date Noted   Gestational hypertension without significant proteinuria 09/01/2023   Gestational hypertension 08/30/2023   Obesity affecting pregnancy, antepartum 05/04/2023   NICHD: Category 1  Membranes:  Intact, no s/s of infection  Induction:    Cytotec x2/1 [facility] @ 1004, 1430  Foley Bulb: No  Pitocin - 0  Pain management:               IV pain management: xPRN  Nitrous: PRN             Epidural  placement:  PRN with cbc redrawn 1 hour prior   GBS Negative   GHTN: asymptomatic, current BP 129/79, PCR 0.10, unremarkable cbc, cmp   Plan: Continue labor plan Continuous monitoring GHTN: monitor BP, repeat cbc, cmp in morning or prior to epidural placement.  Rest Ambulate Frequent position changes to facilitate fetal rotation and descent. Will reassess with cervical exam at 4 hours or earlier if necessary STart pitocin per protocol 1x1 until cervix ripe.  Anticipate labor progression and vaginal delivery.   [facility] aware of plan and verbalized agreement.   [facility] CNM, FNP-C, PMHNP-BC  [address]  [place], [place] [address]  Cell: [phone]  Office Phone: [phone] Fax: [phone] 09/01/2023  7:03 PM

## 2023-09-01 NOTE — Plan of Care (Signed)

## 2023-09-01 NOTE — H&P (Addendum)
Michelle Foley is a 33 y.o. female G3P2002 at [redacted] weeks EGA here for induction of labor for gestational HTN, based on mild range elevated blood pressures at MFM and MAU and with normal preeclampsia labs.  LMP 12/16/22, EDC 09/22/23 by LMP.  Prenatal care was received at [facility] and has been significant for maternal obesity in pregnancy.   OB History     Gravida  3   Para  2   Term  2   Preterm      AB      Living  2      SAB      IAB      Ectopic      Multiple      Live Births  2          Past Medical History:  Diagnosis Date   Chlamydia    GERD (gastroesophageal reflux disease)    Headache(784.0)    History of acute pancreatitis    History of gonorrhea    History of kidney stones    Kidney stone    UTI (lower urinary tract infection)    Vitamin D deficiency    Past Surgical History:  Procedure Laterality Date   CHOLECYSTECTOMY     Family History: family history includes Cancer in her maternal grandfather; Heart attack in her maternal grandfather and mother; Hypertension in her maternal grandfather and mother. Social History:  reports that she has never smoked. She does not have any smokeless tobacco history on file. She reports that she does not drink alcohol and does not use drugs.     Maternal Diabetes: No Genetic Screening: Normal Maternal Ultrasounds/Referrals: Normal Fetal Ultrasounds or other Referrals:  None Maternal Substance Abuse:  No Significant Maternal Medications:  None Significant Maternal Lab Results:  Group B Strep negative Number of Prenatal Visits:greater than 3 verified prenatal visits Maternal Vaccinations: None Other Comments:  None  Review of systems: Constitutional: Denies fevers/chills Cardiovascular: Denies chest pain or palpitations Pulmonary: Denies coughing or wheezing Gastrointestinal: Denies nausea, vomiting or diarrhea Genitourinary: Denies pelvic pain, unusual vaginal bleeding, unusual vaginal  discharge, dysuria, urgency or frequency.  Musculoskeletal: Denies muscle or joint aches and pain.  Neurology: Denies abnormal sensations such as tingling or numbness.    History Dilation: 2 Effacement (%): 50 Station: -3 Exam by:: Dr. Sallye Ober Blood pressure (!) 145/76, pulse 68, temperature 98.3 F (36.8 C), temperature source Oral, resp. rate 18, height 5\' 5"  (1.651 m), weight 116.8 kg, last menstrual period 12/16/2022, SpO2 95%, unknown if currently breastfeeding. BMI 42.87 kg/m2.     09/01/2023    4:14 PM 09/01/2023    3:30 PM 09/01/2023    2:42 PM  Vitals with BMI  Systolic 139 135 161  Diastolic 77 71 76  Pulse 65 57 68    EFM: 130 BL, moderate variability, reactive. TOCO: irregular contractions every 10 minutes.  Exam Physical Exam  Constitutional: She is oriented to person, place, and time. She appears well-developed and well-nourished.  HENT:  Head: Normocephalic and atraumatic.  Neck: Normal range of motion.  Cardiovascular: Normal rate.    Respiratory: Effort normal.   GI: Soft.  Skin: Skin is warm and dry.  Psychiatric: She has a normal mood and affect. Her behavior is normal.   Genitourinary: Gravid uterus, appropriate for gestational age.    Prenatal labs: ABO, Rh: --/--/O POS (02/01 0960) Antibody: NEG (02/01 0939) Rubella:  Immune RPR: NON REACTIVE (02/01 0939)  HBsAg:   Neg  HIV:   NR GBS: Negative/-- (01/23 0000)  Recent Results (from the past 2160 hours)  OB RESULT CONSOLE Group B Strep     Status: None   Collection Time: 08/23/23 12:00 AM  Result Value Ref Range   GBS Negative   Urinalysis, Routine w reflex microscopic -Urine, Clean Catch     Status: Abnormal   Collection Time: 08/27/23  9:30 PM  Result Value Ref Range   Color, Urine AMBER (A) YELLOW    Comment: BIOCHEMICALS MAY BE AFFECTED BY COLOR   APPearance CLOUDY (A) CLEAR   Specific Gravity, Urine 1.029 1.005 - 1.030   pH 5.0 5.0 - 8.0   Glucose, UA NEGATIVE NEGATIVE mg/dL   Hgb urine  dipstick NEGATIVE NEGATIVE   Bilirubin Urine NEGATIVE NEGATIVE   Ketones, ur NEGATIVE NEGATIVE mg/dL   Protein, ur 30 (A) NEGATIVE mg/dL   Nitrite NEGATIVE NEGATIVE   Leukocytes,Ua NEGATIVE NEGATIVE   RBC / HPF 0-5 0 - 5 RBC/hpf   WBC, UA 0-5 0 - 5 WBC/hpf   Bacteria, UA MANY (A) NONE SEEN   Squamous Epithelial / HPF 11-20 0 - 5 /HPF   Mucus PRESENT    Ca Oxalate Crys, UA PRESENT     Comment: Performed at [facility], [address], [place], [place] [address]  Protein / creatinine ratio, urine     Status: Abnormal   Collection Time: 08/27/23  9:30 PM  Result Value Ref Range   Creatinine, Urine 200 mg/dL   Total Protein, Urine 31 mg/dL    Comment: NO NORMAL RANGE ESTABLISHED FOR THIS TEST   Protein Creatinine Ratio 0.16 (H) 0.00 - 0.15 mg/mg[Cre]    Comment: Performed at [facility], [address] [place], [place] [address]  CBC     Status: Abnormal   Collection Time: 08/27/23  9:58 PM  Result Value Ref Range   WBC 11.1 (H) 4.0 - 10.5 K/uL   RBC 4.20 3.87 - 5.11 MIL/uL   Hemoglobin 12.1 12.0 - 15.0 g/dL   HCT 09.8 11.9 - 14.7 %   MCV 86.0 80.0 - 100.0 fL   MCH 28.8 26.0 - 34.0 pg   MCHC 33.5 30.0 - 36.0 g/dL   RDW 82.9 56.2 - 13.0 %   Platelets 227 150 - 400 K/uL   nRBC 0.0 0.0 - 0.2 %    Comment: Performed at [facility], [address] [place], [place] [address]  Comprehensive metabolic panel     Status: Abnormal   Collection Time: 08/27/23  9:58 PM  Result Value Ref Range   Sodium 135 135 - 145 mmol/L   Potassium 3.7 3.5 - 5.1 mmol/L   Chloride 107 98 - 111 mmol/L   CO2 18 (L) 22 - 32 mmol/L   Glucose, Bld 93 70 - 99 mg/dL    Comment: Glucose reference range applies only to samples taken after fasting for at least 8 hours.   BUN 13 6 - 20 mg/dL   Creatinine, Ser 4.69 0.44 - 1.00 mg/dL   Calcium 9.0 8.9 - 62.9 mg/dL   Total Protein 6.5 6.5 - 8.1 g/dL   Albumin 2.5 (L) 3.5 - 5.0 g/dL   AST 19 15 - 41 U/L   ALT 16 0 - 44 U/L   Alkaline  Phosphatase 179 (H) 38 - 126 U/L   Total Bilirubin 0.6 0.0 - 1.2 mg/dL   GFR, Estimated >52 >84 mL/min    Comment: (NOTE) Calculated using  the CKD-EPI Creatinine Equation (2021)    Anion gap 10 5 - 15    Comment: Performed at [facility], [address] [place], [place] [address]  CBC     Status: Abnormal   Collection Time: 09/01/23  9:39 AM  Result Value Ref Range   WBC 8.2 4.0 - 10.5 K/uL   RBC 4.01 3.87 - 5.11 MIL/uL   Hemoglobin 11.6 (L) 12.0 - 15.0 g/dL   HCT 60.4 (L) 54.0 - 98.1 %   MCV 85.5 80.0 - 100.0 fL   MCH 28.9 26.0 - 34.0 pg   MCHC 33.8 30.0 - 36.0 g/dL   RDW 19.1 47.8 - 29.5 %   Platelets 213 150 - 400 K/uL   nRBC 0.0 0.0 - 0.2 %    Comment: Performed at [facility], [address], [place], [place] [address]  RPR     Status: None   Collection Time: 09/01/23  9:39 AM  Result Value Ref Range   RPR Ser Ql NON REACTIVE NON REACTIVE    Comment: Performed at [facility], [address], [place], [place] [address]  Type and screen [facility]     Status: None   Collection Time: 09/01/23  9:39 AM  Result Value Ref Range   ABO/RH(D) O POS    Antibody Screen NEG    Sample Expiration      09/04/2023,2359 Performed at [facility], [address], [place], [place] [address]   Protein / creatinine ratio, urine     Status: None   Collection Time: 09/01/23  9:58 AM  Result Value Ref Range   Creatinine, Urine 195 mg/dL   Total Protein, Urine 19 mg/dL    Comment: NO NORMAL RANGE ESTABLISHED FOR THIS TEST   Protein Creatinine Ratio 0.10 0.00 - 0.15 mg/mg[Cre]    Comment: Performed at [facility], [address], [place], [place] [address]  Comprehensive metabolic panel     Status: Abnormal   Collection Time: 09/01/23 11:20 AM  Result Value Ref Range   Sodium 136 135 - 145 mmol/L   Potassium 4.2 3.5 - 5.1 mmol/L   Chloride 105 98 - 111 mmol/L   CO2 19 (L) 22 - 32 mmol/L   Glucose, Bld 76 70 - 99 mg/dL    Comment:  Glucose reference range applies only to samples taken after fasting for at least 8 hours.   BUN 8 6 - 20 mg/dL   Creatinine, Ser 1.32 0.44 - 1.00 mg/dL   Calcium 8.7 (L) 8.9 - 10.3 mg/dL   Total Protein 6.0 (L) 6.5 - 8.1 g/dL   Albumin 2.4 (L) 3.5 - 5.0 g/dL   AST 18 15 - 41 U/L   ALT 16 0 - 44 U/L   Alkaline Phosphatase 224 (H) 38 - 126 U/L   Total Bilirubin 0.7 0.0 - 1.2 mg/dL   GFR, Estimated >44 >01 mL/min    Comment: (NOTE) Calculated using the CKD-EPI Creatinine Equation (2021)    Anion gap 12 5 - 15    Comment: Performed at [facility], [address] [place], [place] [address]      Current Outpatient Medications  Medication Instructions   ondansetron (ZOFRAN-ODT) 4 mg, Oral, Every 6 hours PRN   pantoprazole (PROTONIX) 40 mg, Daily   Prenatal Vit-Fe Fumarate-FA (PRENATAL MULTIVITAMIN) TABS 1 tablet, Daily   promethazine (PHENERGAN) 12.5 mg, Every 6 hours PRN   VITAMIN D  PO Take by mouth.   Vitamin D3 50 mcg, Daily    Allergies  Allergen Reactions   Amoxicillin-Pot Clavulanate Nausea And Vomiting   Codeine      Assessment/Plan: 33 y/o G3P2002 at [redacted] weeks EGA here for induction of labor for gestational hypertension,  - Admit to Labor and Delivery as per orders. - Plan for cervical ripening start then pitocin (secong vaginal cytotec placed in at about 2.40 pm). I discussed with patient risks, benefits and alternatives of labor induction.  Patient expressed understanding of all this and desired to proceed with the induction.      Prescilla Sours, MD.  09/01/2023, 4:01 PM

## 2023-09-02 ENCOUNTER — Other Ambulatory Visit: Payer: Self-pay

## 2023-09-02 ENCOUNTER — Encounter (HOSPITAL_COMMUNITY): Payer: Self-pay | Admitting: Obstetrics & Gynecology

## 2023-09-02 DIAGNOSIS — Z3490 Encounter for supervision of normal pregnancy, unspecified, unspecified trimester: Secondary | ICD-10-CM | POA: Diagnosis present

## 2023-09-02 LAB — CBC
HCT: 32.9 % — ABNORMAL LOW (ref 36.0–46.0)
HCT: 35.3 % — ABNORMAL LOW (ref 36.0–46.0)
Hemoglobin: 11.3 g/dL — ABNORMAL LOW (ref 12.0–15.0)
Hemoglobin: 11.9 g/dL — ABNORMAL LOW (ref 12.0–15.0)
MCH: 28.8 pg (ref 26.0–34.0)
MCH: 29.4 pg (ref 26.0–34.0)
MCHC: 33.7 g/dL (ref 30.0–36.0)
MCHC: 34.3 g/dL (ref 30.0–36.0)
MCV: 85.5 fL (ref 80.0–100.0)
MCV: 85.7 fL (ref 80.0–100.0)
Platelets: 179 10*3/uL (ref 150–400)
Platelets: 209 10*3/uL (ref 150–400)
RBC: 3.84 MIL/uL — ABNORMAL LOW (ref 3.87–5.11)
RBC: 4.13 MIL/uL (ref 3.87–5.11)
RDW: 13.2 % (ref 11.5–15.5)
RDW: 13.3 % (ref 11.5–15.5)
WBC: 10.2 10*3/uL (ref 4.0–10.5)
WBC: 15.3 10*3/uL — ABNORMAL HIGH (ref 4.0–10.5)
nRBC: 0 % (ref 0.0–0.2)
nRBC: 0 % (ref 0.0–0.2)

## 2023-09-02 LAB — COMPREHENSIVE METABOLIC PANEL
ALT: 17 U/L (ref 0–44)
ALT: 17 U/L (ref 0–44)
AST: 19 U/L (ref 15–41)
AST: 20 U/L (ref 15–41)
Albumin: 2 g/dL — ABNORMAL LOW (ref 3.5–5.0)
Albumin: 2.3 g/dL — ABNORMAL LOW (ref 3.5–5.0)
Alkaline Phosphatase: 176 U/L — ABNORMAL HIGH (ref 38–126)
Alkaline Phosphatase: 213 U/L — ABNORMAL HIGH (ref 38–126)
Anion gap: 8 (ref 5–15)
Anion gap: 9 (ref 5–15)
BUN: 7 mg/dL (ref 6–20)
BUN: 7 mg/dL (ref 6–20)
CO2: 20 mmol/L — ABNORMAL LOW (ref 22–32)
CO2: 20 mmol/L — ABNORMAL LOW (ref 22–32)
Calcium: 9 mg/dL (ref 8.9–10.3)
Calcium: 9.1 mg/dL (ref 8.9–10.3)
Chloride: 108 mmol/L (ref 98–111)
Chloride: 110 mmol/L (ref 98–111)
Creatinine, Ser: 0.7 mg/dL (ref 0.44–1.00)
Creatinine, Ser: 0.72 mg/dL (ref 0.44–1.00)
GFR, Estimated: >60 mL/min (ref >60)
GFR, Estimated: >60 mL/min (ref >60)
Glucose, Bld: 85 mg/dL (ref 70–99)
Glucose, Bld: 93 mg/dL (ref 70–99)
Potassium: 3.4 mmol/L — ABNORMAL LOW (ref 3.5–5.1)
Potassium: 3.7 mmol/L (ref 3.5–5.1)
Sodium: 137 mmol/L (ref 135–145)
Sodium: 138 mmol/L (ref 135–145)
Total Bilirubin: 0.6 mg/dL (ref 0.0–1.2)
Total Bilirubin: 0.7 mg/dL (ref 0.0–1.2)
Total Protein: 5.3 g/dL — ABNORMAL LOW (ref 6.5–8.1)
Total Protein: 5.9 g/dL — ABNORMAL LOW (ref 6.5–8.1)

## 2023-09-02 MED ORDER — EPHEDRINE 5 MG/ML INJ: 10.0000 mg | INTRAVENOUS | Status: DC | PRN

## 2023-09-02 MED ORDER — FENTANYL-BUPIVACAINE-NACL 0.5-0.125-0.9 MG/250ML-% EP SOLN
12.0000 mL/h | EPIDURAL | Status: DC | PRN
  Filled 2023-09-02: qty 250

## 2023-09-02 MED ORDER — DIBUCAINE (PERIANAL) 1 % EX OINT: 1.0000 | TOPICAL_OINTMENT | CUTANEOUS | Status: DC | PRN

## 2023-09-02 MED ORDER — LACTATED RINGERS IV SOLN: 500.0000 mL | Freq: Once | INTRAVENOUS | Status: DC

## 2023-09-02 MED ORDER — PRENATAL MULTIVITAMIN CH
1.0000 | ORAL_TABLET | Freq: Every day | ORAL | Status: DC
  Administered 2023-09-02 – 2023-09-03 (×2): 1 via ORAL
  Filled 2023-09-02 (×2): qty 1

## 2023-09-02 MED ORDER — ONDANSETRON HCL 4 MG PO TABS: 4.0000 mg | ORAL_TABLET | ORAL | Status: DC | PRN

## 2023-09-02 MED ORDER — WITCH HAZEL-GLYCERIN EX PADS: 1.0000 | MEDICATED_PAD | CUTANEOUS | Status: DC | PRN

## 2023-09-02 MED ORDER — NIFEDIPINE ER OSMOTIC RELEASE 30 MG PO TB24
30.0000 mg | ORAL_TABLET | Freq: Once | ORAL | Status: AC
  Administered 2023-09-02: 30 mg via ORAL
  Filled 2023-09-02: qty 1

## 2023-09-02 MED ORDER — ZOLPIDEM TARTRATE 5 MG PO TABS: 5.0000 mg | ORAL_TABLET | Freq: Every evening | ORAL | Status: DC | PRN

## 2023-09-02 MED ORDER — DIPHENHYDRAMINE HCL 50 MG/ML IJ SOLN: 12.5000 mg | INTRAMUSCULAR | Status: DC | PRN

## 2023-09-02 MED ORDER — DIPHENHYDRAMINE HCL 25 MG PO CAPS: 25.0000 mg | ORAL_CAPSULE | Freq: Four times a day (QID) | ORAL | Status: DC | PRN

## 2023-09-02 MED ORDER — COCONUT OIL OIL: 1.0000 | TOPICAL_OIL | Status: DC | PRN

## 2023-09-02 MED ORDER — NIFEDIPINE ER OSMOTIC RELEASE 30 MG PO TB24
30.0000 mg | ORAL_TABLET | Freq: Every day | ORAL | Status: DC
  Administered 2023-09-02: 30 mg via ORAL
  Filled 2023-09-02: qty 1

## 2023-09-02 MED ORDER — ONDANSETRON HCL 4 MG/2ML IJ SOLN: 4.0000 mg | INTRAMUSCULAR | Status: DC | PRN

## 2023-09-02 MED ORDER — TETANUS-DIPHTH-ACELL PERTUSSIS 5-2.5-18.5 LF-MCG/0.5 IM SUSY: 0.5000 mL | PREFILLED_SYRINGE | Freq: Once | INTRAMUSCULAR | Status: DC

## 2023-09-02 MED ORDER — BENZOCAINE-MENTHOL 20-0.5 % EX AERO
1.0000 | INHALATION_SPRAY | CUTANEOUS | Status: DC | PRN
  Administered 2023-09-02: 1 via TOPICAL
  Filled 2023-09-02: qty 56

## 2023-09-02 MED ORDER — IBUPROFEN 600 MG PO TABS
600.0000 mg | ORAL_TABLET | Freq: Four times a day (QID) | ORAL | Status: DC
  Administered 2023-09-02 – 2023-09-03 (×7): 600 mg via ORAL
  Filled 2023-09-02 (×7): qty 1

## 2023-09-02 MED ORDER — PHENYLEPHRINE 80 MCG/ML (10ML) SYRINGE FOR IV PUSH (FOR BLOOD PRESSURE SUPPORT): 80.0000 ug | PREFILLED_SYRINGE | INTRAVENOUS | Status: DC | PRN

## 2023-09-02 MED ORDER — SIMETHICONE 80 MG PO CHEW: 80.0000 mg | CHEWABLE_TABLET | ORAL | Status: DC | PRN

## 2023-09-02 MED ORDER — NIFEDIPINE ER OSMOTIC RELEASE 30 MG PO TB24
60.0000 mg | ORAL_TABLET | Freq: Every day | ORAL | Status: DC
  Administered 2023-09-03: 60 mg via ORAL
  Filled 2023-09-02: qty 2

## 2023-09-02 MED ORDER — ACETAMINOPHEN 325 MG PO TABS
650.0000 mg | ORAL_TABLET | ORAL | Status: DC | PRN
  Filled 2023-09-02: qty 2

## 2023-09-02 MED ORDER — SENNOSIDES-DOCUSATE SODIUM 8.6-50 MG PO TABS
2.0000 | ORAL_TABLET | Freq: Every day | ORAL | Status: DC
  Administered 2023-09-03: 2 via ORAL
  Filled 2023-09-02: qty 2

## 2023-09-02 NOTE — Lactation Note (Signed)
This note was copied from a baby's chart. Lactation Consultation Note  Patient Name: Michelle Foley [id number] Date: 09/02/2023 Age:34 hours  Mom declines Lactation services. Mom stated if she needs Lactation she will call for Lactation.   Maternal Data    Feeding Nipple Type: Slow - flow  LATCH Score                    Lactation Tools Discussed/Used    Interventions    Discharge    Consult Status Consult Status: Complete    [name] G 09/02/2023, 5:58 AM

## 2023-09-02 NOTE — Plan of Care (Signed)

## 2023-09-03 MED ORDER — IBUPROFEN 600 MG PO TABS: 600.0000 mg | ORAL_TABLET | Freq: Four times a day (QID) | ORAL | 0 refills | Status: AC

## 2023-09-03 MED ORDER — ACETAMINOPHEN 325 MG PO TABS: 650.0000 mg | ORAL_TABLET | ORAL | 0 refills | Status: AC | PRN

## 2023-09-03 MED ORDER — BENZOCAINE-MENTHOL 20-0.5 % EX AERO: 1.0000 | INHALATION_SPRAY | CUTANEOUS | 1 refills | Status: AC | PRN

## 2023-09-03 MED ORDER — NIFEDIPINE ER 60 MG PO TB24: 60.0000 mg | ORAL_TABLET | Freq: Every day | ORAL | 0 refills | Status: AC

## 2023-09-03 NOTE — Discharge Summary (Signed)
Postpartum Discharge Summary  Date of Service updated2/3/25     Patient Name: Michelle Foley DOB: 1991/06/15 MRN: [id number]  Date of admission: 09/01/2023 Delivery date:09/02/2023 Delivering provider: [place] Date of discharge: 09/03/2023  Admitting diagnosis: Gestational hypertension without significant proteinuria [O13.9] Intrauterine pregnancy: [redacted]w[redacted]d     Secondary diagnosis:  Principal Problem:   Gestational hypertension without significant proteinuria Active Problems:   SVD (spontaneous vaginal delivery)   Encounter for induction of labor   Normal postpartum course  Additional problems:     Discharge diagnosis: Term Pregnancy Delivered and Gestational Hypertension                                              Post partum procedures:   Augmentation: Pitocin Complications: None  Hospital course: Onset of Labor With Vaginal Delivery      33 y.o. yo G3P3003 at [redacted]w[redacted]d was admitted in Latent Labor on 09/01/2023. Labor course was complicated bynothing  Membrane Rupture Time/Date: 1:00 AM,09/02/2023  Delivery Method:Vaginal, Spontaneous Operative Delivery:N/A Episiotomy: None Lacerations:  None Patient had a postpartum course complicated by GHTN.  She is ambulating, tolerating a regular diet, passing flatus, and urinating well. Patient is discharged home in stable condition on 09/03/23.  Newborn Data: Birth date:09/02/2023 Birth time:1:00 AM Gender:Female Living status:Living Apgars:9 ,9  Weight:2720 g  Magnesium Sulfate received: No BMZ received: No Rhophylac:No MMR:No T-DaP:   Flu: No RSV Vaccine received: No Transfusion:No Immunizations administered: There is no immunization history for the selected administration types on file for this patient.  Physical exam  Vitals:   09/03/23 0528 09/03/23 1045 09/03/23 1152 09/03/23 1350  BP: 124/71 137/84 120/81 125/82  Pulse: 65 75 77 71  Resp: 16 16 16 16   Temp: 97.8 F (36.6 C) 98.2 F (36.8 C)  98.6 F (37 C)   TempSrc: Oral Oral  Oral  SpO2: 98%     Weight:      Height:       General: alert and cooperative Lochia: appropriate Uterine Fundus: firm Incision: N/A DVT Evaluation: Negative Homan's sign. Labs: Lab Results  Component Value Date   WBC 15.3 (H) 09/02/2023   HGB 11.3 (L) 09/02/2023   HCT 32.9 (L) 09/02/2023   MCV 85.7 09/02/2023   PLT 179 09/02/2023      Latest Ref Rng & Units 09/02/2023    4:34 AM  CMP  Glucose 70 - 99 mg/dL 93   BUN 6 - 20 mg/dL 7   Creatinine 6.96 - 2.95 mg/dL 2.84   Sodium 132 - 440 mmol/L 138   Potassium 3.5 - 5.1 mmol/L 3.4   Chloride 98 - 111 mmol/L 110   CO2 22 - 32 mmol/L 20   Calcium 8.9 - 10.3 mg/dL 9.0   Total Protein 6.5 - 8.1 g/dL 5.3   Total Bilirubin 0.0 - 1.2 mg/dL 0.7   Alkaline Phos 38 - 126 U/L 176   AST 15 - 41 U/L 20   ALT 0 - 44 U/L 17    Edinburgh Score:    09/02/2023   11:38 PM  Edinburgh Postnatal Depression Scale Screening Tool  I have been able to laugh and see the funny side of things. 0  I have looked forward with enjoyment to things. 0  I have blamed myself unnecessarily when things went wrong. 1  I have been anxious or  worried for no good reason. 1  I have felt scared or panicky for no good reason. 0  Things have been getting on top of me. 0  I have been so unhappy that I have had difficulty sleeping. 0  I have felt sad or miserable. 1  I have been so unhappy that I have been crying. 0  The thought of harming myself has occurred to me. 0  Edinburgh Postnatal Depression Scale Total 3      After visit meds:  Allergies as of 09/03/2023       Reactions   Amoxicillin-pot Clavulanate Nausea And Vomiting   Codeine         Medication List     STOP taking these medications    ondansetron 4 MG disintegrating tablet Commonly known as: ZOFRAN-ODT   promethazine 12.5 MG tablet Commonly known as: PHENERGAN   VITAMIN D PO       TAKE these medications    acetaminophen 325 MG tablet Commonly known as:  Tylenol Take 2 tablets (650 mg total) by mouth every 4 (four) hours as needed (for pain scale < 4).   benzocaine-Menthol 20-0.5 % Aero Commonly known as: DERMOPLAST Apply 1 Application topically as needed for irritation (perineal discomfort).   ibuprofen 600 MG tablet Commonly known as: ADVIL Take 1 tablet (600 mg total) by mouth every 6 (six) hours. Start taking on: September 04, 2023   NIFEdipine 60 MG 24 hr tablet Commonly known as: ADALAT CC Take 1 tablet (60 mg total) by mouth daily. Start taking on: September 04, 2023   pantoprazole 40 MG tablet Commonly known as: PROTONIX Take 40 mg by mouth daily.   prenatal multivitamin Tabs tablet Take 1 tablet by mouth daily.   Vitamin D3 50 MCG (2000 UT) Tabs Take 50 mcg by mouth daily.         Discharge home in stable condition Infant Feeding: Breast Infant Disposition:home with mother Discharge instruction: per After Visit Summary and Postpartum booklet. Activity: Advance as tolerated. Pelvic rest for 6 weeks.  Diet: routine diet Anticipated Birth Control: Plans Interval BTL Postpartum Appointment:4 weeks Additional Postpartum F/U:    Future Appointments:No future appointments. Follow up Visit:  Follow-up Information     [facility]. Schedule an appointment as soon as possible for a visit in 4 week(s).   Specialty: Obstetrics and Gynecology Contact information: [address] [place] [address] [phone]                    09/03/2023 Michael Litter, MD

## 2023-09-03 NOTE — Plan of Care (Signed)

## 2023-09-11 ENCOUNTER — Telehealth (HOSPITAL_COMMUNITY): Payer: Self-pay

## 2023-09-11 NOTE — Telephone Encounter (Signed)
09/11/2023 1806  Name: Michelle Foley MRN: [id number] DOB: 09/27/1990  Reason for Call:  Transition of Care Hospital Discharge Call  Contact Status: Patient Contact Status: Complete  Language assistant needed:          Follow-Up Questions: Do You Have Any Concerns About Your Health As You Heal From Delivery?: No Do You Have Any Concerns About Your Infants Health?: No  Edinburgh Postnatal Depression Scale:  In the Past 7 Days: I have been able to laugh and see the funny side of things.: As much as I always could I have looked forward with enjoyment to things.: As much as I ever did I have blamed myself unnecessarily when things went wrong.: No, never I have been anxious or worried for no good reason.: No, not at all I have felt scared or panicky for no good reason.: No, not at all Things have been getting on top of me.: No, most of the time I have coped quite well I have been so unhappy that I have had difficulty sleeping.: Not at all I have felt sad or miserable.: No, not at all I have been so unhappy that I have been crying.: Only occasionally The thought of harming myself has occurred to me.: Never Edinburgh Postnatal Depression Scale Total: 2  PHQ2-9 Depression Scale:     Discharge Follow-up: Edinburgh score requires follow up?: No Patient was advised of the following resources:: Breastfeeding Support Group, Support Group  Post-discharge interventions: Reviewed Newborn Safe Sleep Practices  Signature  Signe Colt
